# Patient Record
Sex: Female | Born: 2000 | Race: White | Hispanic: No | Marital: Single | Smoking: Never smoker
Health system: Southern US, Community
[De-identification: ages and names within clinical notes are randomized; demographics above are authoritative.]

---
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
## 2020-06-03 ENCOUNTER — Encounter (HOSPITAL_COMMUNITY): Payer: Self-pay | Admitting: Licensed Clinical Social Worker

## 2020-06-03 ENCOUNTER — Other Ambulatory Visit: Payer: Self-pay

## 2020-06-03 ENCOUNTER — Ambulatory Visit (INDEPENDENT_AMBULATORY_CARE_PROVIDER_SITE_OTHER): Payer: Self-pay | Admitting: Licensed Clinical Social Worker

## 2020-06-03 DIAGNOSIS — F4321 Adjustment disorder with depressed mood: Secondary | ICD-10-CM
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
## 2020-06-19 ENCOUNTER — Other Ambulatory Visit: Payer: Self-pay

## 2020-06-19 ENCOUNTER — Ambulatory Visit (HOSPITAL_COMMUNITY): Payer: Self-pay | Admitting: Licensed Clinical Social Worker

## 2020-07-01 ENCOUNTER — Ambulatory Visit (HOSPITAL_COMMUNITY): Payer: Self-pay | Admitting: Licensed Clinical Social Worker

## 2020-07-02 ENCOUNTER — Other Ambulatory Visit: Payer: Self-pay

## 2020-07-02 ENCOUNTER — Ambulatory Visit (INDEPENDENT_AMBULATORY_CARE_PROVIDER_SITE_OTHER): Payer: No Typology Code available for payment source | Admitting: Licensed Clinical Social Worker

## 2020-07-02 ENCOUNTER — Encounter (HOSPITAL_COMMUNITY): Payer: Self-pay | Admitting: Licensed Clinical Social Worker

## 2020-07-02 DIAGNOSIS — F4321 Adjustment disorder with depressed mood: Secondary | ICD-10-CM | POA: Diagnosis not present

## 2020-07-02 NOTE — Progress Notes (Signed)
Virtual Visit via Video Note  I connected with Kayla Nguyen on 07/02/20 at 12:30 PM EDT by a video enabled telemedicine application and verified that I am speaking with the correct person using two identifiers.  Location: Patient: home Provider: home office   I discussed the limitations of evaluation and management by telemedicine and the availability of in person appointments. The patient expressed understanding and agreed to proceed.  Type of Therapy: Individual    Treatment Goals addressed: "I just need someone to talk to about my family situation, get some advice, and accept me for who I am. Currently feeling depressed almost every day. Current goal will be to report depression less than 5 days per week".   Interventions: CBT and Motivational Interviewing    Summary: Kayla Nguyen is a 20 y.o. female who presents with Panic Disorder    Suicidal/Homicidal: No without intent/plan    Therapist Response: [name] met with clinician for an individual session. Kayla Nguyen discussed her psychiatric symptoms, her current life events and her homework. Kayla Nguyen shared that she has been doing pretty good this week. She reports that today is her 2 year anniversary with her boyfriend, and they have almost lived together for 1 year. Clinician explored family relationships and noted that her parents continue to be accepting of her decision to move out, even though they do not like her to live with her boyfriend out of wedlock. Kayla Nguyen reports she continues to be the "black sheep" with her grandparents and her aunt and uncle. Clinician processed the impact of religion on the value system of her family members. Clinician also noted that Kayla Nguyen is an adult and can make her own decisions, whether they are "good or bad". Clinician encouraged Kayla Nguyen to remain close to her parents, who are supportive, and to work on reducing the impact of her extended family's negative opinions.    Plan: Return again in 2-3 weeks.     Diagnosis: Adjustment Disorder with Depressed mood     I discussed the assessment and treatment plan with the patient. The patient was provided an opportunity to ask questions and all were answered. The patient agreed with the plan and demonstrated an understanding of the instructions.   The patient was advised to call back or seek an in-person evaluation if the symptoms worsen or if the condition fails to improve as anticipated.  I provided 45 minutes of non-face-to-face time during this encounter.   Mindi Curling, LCSW

## 2020-07-15 ENCOUNTER — Ambulatory Visit (HOSPITAL_COMMUNITY): Payer: No Typology Code available for payment source | Admitting: Licensed Clinical Social Worker

## 2020-07-15 ENCOUNTER — Other Ambulatory Visit: Payer: Self-pay

## 2020-07-29 ENCOUNTER — Ambulatory Visit (INDEPENDENT_AMBULATORY_CARE_PROVIDER_SITE_OTHER): Payer: No Typology Code available for payment source | Admitting: Licensed Clinical Social Worker

## 2020-07-29 ENCOUNTER — Other Ambulatory Visit: Payer: Self-pay

## 2020-07-29 DIAGNOSIS — F4321 Adjustment disorder with depressed mood: Secondary | ICD-10-CM | POA: Diagnosis not present

## 2020-07-31 ENCOUNTER — Encounter (HOSPITAL_COMMUNITY): Payer: Self-pay | Admitting: Licensed Clinical Social Worker

## 2020-07-31 NOTE — Progress Notes (Signed)
Virtual Visit via Video Note  I connected with Kayla Nguyen on 07/31/20 at 12:30 PM EDT by a video enabled telemedicine application and verified that I am speaking with the correct person using two identifiers.  Location: Patient: home Provider: home office   I discussed the limitations of evaluation and management by telemedicine and the availability of in person appointments. The patient expressed understanding and agreed to proceed.   Type of Therapy: Individual   Treatment Goals addressed: "I just need someone to talk to about my family situation, get some advice, and accept me for who I am. Currently feeling depressed almost every day. Current goal will be to report depression less than 5 days per week".   Interventions: CBT and Motivational Interviewing   Summary: Kayla Nguyen is a 20 y.o. female who presents with Panic Disorder   Suicidal/Homicidal: No without intent/plan   Therapist Response: [name] met with clinician for an individual session. Kayla Nguyen discussed her psychiatric symptoms, her current life events and her homework. Kayla Nguyen shared that things are better this week due to news that the roommate will be moving out in July. Clinician explored this update and processed thoughts and feelings using CBT. Clinician explored the pros and cons of the roommate moving out and noted some nervousness and excitement about having the house to herself with just her boyfriend. Kayla Nguyen shared a challenge in her relationship related to time spent together and alone. Clinician utilized CBT to assist in reality testing about her expectations of spending time with boyfriend. Clinician also processed communication skills and assisted in creating a script to talk to boyfriend about the time he spends on the video game vs. with [name].    Plan: Return again in 2-3 weeks.   Diagnosis: Adjustment Disorder with Depressed mood   I discussed the assessment and treatment plan with the patient. The  patient was provided an opportunity to ask questions and all were answered. The patient agreed with the plan and demonstrated an understanding of the instructions.   The patient was advised to call back or seek an in-person evaluation if the symptoms worsen or if the condition fails to improve as anticipated.  I provided 45 minutes of non-face-to-face time during this encounter.   Mindi Curling, LCSW

## 2020-08-12 ENCOUNTER — Ambulatory Visit (HOSPITAL_COMMUNITY): Payer: No Typology Code available for payment source | Admitting: Licensed Clinical Social Worker

## 2020-08-12 ENCOUNTER — Other Ambulatory Visit: Payer: Self-pay

## 2020-09-15 ENCOUNTER — Other Ambulatory Visit: Payer: Self-pay | Admitting: Neurology

## 2020-09-15 DIAGNOSIS — R519 Headache, unspecified: Secondary | ICD-10-CM

## 2020-09-19 ENCOUNTER — Ambulatory Visit
Admission: RE | Admit: 2020-09-19 | Discharge: 2020-09-19 | Disposition: A | Discharge status: Home / Self Care | Payer: No Typology Code available for payment source | Source: Ambulatory Visit | Attending: Neurology | Admitting: Neurology

## 2020-09-19 ENCOUNTER — Other Ambulatory Visit: Payer: Self-pay

## 2020-09-19 DIAGNOSIS — R519 Headache, unspecified: Secondary | ICD-10-CM | POA: Insufficient documentation

## 2021-01-06 ENCOUNTER — Ambulatory Visit
Admission: EM | Admit: 2021-01-06 | Discharge: 2021-01-06 | Disposition: A | Discharge status: Home / Self Care | Payer: No Typology Code available for payment source | Attending: Emergency Medicine | Admitting: Emergency Medicine

## 2021-01-06 ENCOUNTER — Ambulatory Visit (INDEPENDENT_AMBULATORY_CARE_PROVIDER_SITE_OTHER): Payer: No Typology Code available for payment source

## 2021-01-06 ENCOUNTER — Other Ambulatory Visit: Payer: Self-pay

## 2021-01-06 ENCOUNTER — Encounter: Payer: Self-pay | Admitting: Emergency Medicine

## 2021-01-06 DIAGNOSIS — M79645 Pain in left finger(s): Secondary | ICD-10-CM | POA: Diagnosis not present

## 2021-01-06 NOTE — ED Triage Notes (Signed)
Pt here with left index finger pain from overuse on a machine from work.

## 2021-01-06 NOTE — ED Provider Notes (Signed)
Kayla Nguyen    CSN: [id number] Arrival date & time: 01/06/21  1745      History   Chief Complaint Chief Complaint  Patient presents with   Finger Injury    HPI Kayla Nguyen is a 20 y.o. female.  Patient presents with 1 week history of pain and swelling in her left index finger at the PIP joint.  No falls or trauma.  She states she believes the pain is from overuse at work.  She denies numbness, weakness, paresthesias, wounds, redness, bruising, or other symptoms.  No treatments attempted at home.  Patient requests an x-ray of her finger and a finger splint.  The history is provided by the patient.   History reviewed. No pertinent past medical history.  Patient Active Problem List   Diagnosis Date Noted   Croup 01/21/2011    Class: Acute    History reviewed. No pertinent surgical history.  OB History   No obstetric history on file.      Home Medications    Prior to Admission medications   Medication Sig Start Date End Date Taking? Authorizing Provider  ALPRAZolam Prudy Feeler) 0.5 MG tablet Take 0.5 mg by mouth at bedtime as needed for anxiety.    [provider]    Family History Family History  Problem Relation Age of Onset   Healthy Mother    Healthy Father     Social History Social History   Tobacco Use   Smoking status: Never   Smokeless tobacco: Never  Vaping Use   Vaping Use: Never used  Substance Use Topics   Alcohol use: No   Drug use: No     Allergies   Patient has no known allergies.   Review of Systems Review of Systems  Constitutional:  Negative for chills and fever.  Respiratory:  Negative for cough and shortness of breath.   Musculoskeletal:  Positive for arthralgias and joint swelling.  Skin:  Negative for color change, rash and wound.  Neurological:  Negative for weakness and numbness.  All other systems reviewed and are negative.   Physical Exam Triage Vital Signs ED Triage Vitals  Enc Vitals Group      BP      Pulse      Resp      Temp      Temp src      SpO2      Weight      Height      Head Circumference      Peak Flow      Pain Score      Pain Loc      Pain Edu?      Excl. in GC?    No data found.  Updated Vital Signs BP 135/88 (BP Location: Left Arm)   Pulse 80   Temp 98.2 F (36.8 C) (Oral)   Resp 16   LMP 12/30/2020 (Approximate)   SpO2 99%   Visual Acuity Right Eye Distance:   Left Eye Distance:   Bilateral Distance:    Right Eye Near:   Left Eye Near:    Bilateral Near:     Physical Exam Vitals and nursing note reviewed.  Constitutional:      General: She is not in acute distress.    Appearance: She is well-developed.  HENT:     Head: Normocephalic and atraumatic.     Mouth/Throat:     Mouth: Mucous membranes are moist.  Eyes:     Conjunctiva/sclera: Conjunctivae  normal.  Cardiovascular:     Rate and Rhythm: Normal rate and regular rhythm.     Heart sounds: Normal heart sounds.  Pulmonary:     Effort: Pulmonary effort is normal. No respiratory distress.     Breath sounds: Normal breath sounds.  Abdominal:     Palpations: Abdomen is soft.     Tenderness: There is no abdominal tenderness.  Musculoskeletal:        General: Swelling and tenderness present. No deformity. Normal range of motion.       Hands:     Cervical back: Neck supple.     Comments: Mild edema and tenderness of left index PIP.  Skin:    General: Skin is warm and dry.     Capillary Refill: Capillary refill takes less than 2 seconds.     Findings: No bruising, erythema, lesion or rash.  Neurological:     General: No focal deficit present.     Mental Status: She is alert and oriented to person, place, and time.     Sensory: No sensory deficit.     Motor: No weakness.     Gait: Gait normal.  Psychiatric:        Mood and Affect: Mood normal.        Behavior: Behavior normal.     UC Treatments / Results  Labs (all labs ordered are listed, but only abnormal results are  displayed) Labs Reviewed - No data to display  EKG   Radiology DG Finger Index Left  Result Date: 01/06/2021 CLINICAL DATA:  Equipment injury. Swelling with decreased range of motion. Pain. EXAM: LEFT INDEX FINGER 2+V COMPARISON:  None. FINDINGS: There is no evidence of fracture or dislocation. Normal joint spaces and alignment. There is no evidence of arthropathy or other focal bone abnormality. No erosion or periosteal reaction. Mild soft tissue edema. No soft tissue air. No radiopaque foreign body IMPRESSION: Mild soft tissue edema without osseous abnormality. Electronically Signed   By: Narda Rutherford M.D.   On: 01/06/2021 19:04    Procedures Procedures (including critical care time)  Medications Ordered in UC Medications - No data to display  Initial Impression / Assessment and Plan / UC Course  I have reviewed the triage vital signs and the nursing notes.  Pertinent labs & imaging results that were available during my care of the patient were reviewed by me and considered in my medical decision making (see chart for details).  Left index finger pain and swelling.  X-ray negative for bone abnormality.  Treating with Tylenol or ibuprofen, rest, finger splint.  Instructed patient to follow-up with an orthopedic hand specialist if her symptoms are not improving.  She agrees to plan of care.   Final Clinical Impressions(s) / UC Diagnoses   Final diagnoses:  Pain of finger of left hand     Discharge Instructions      Take Tylenol or ibuprofen as needed for discomfort.  Rest your finger.  Wear the finger splint as needed for comfort.  Follow-up with an orthopedic hand specialist such as the one listed below if your symptoms are not improving.     ED Prescriptions   None    PDMP not reviewed this encounter.   Mickie Bail, NP 01/06/21 1910

## 2021-01-06 NOTE — Discharge Instructions (Addendum)
Take Tylenol or ibuprofen as needed for discomfort.  Rest your finger.  Wear the finger splint as needed for comfort.  Follow-up with an orthopedic hand specialist such as the one listed below if your symptoms are not improving.

## 2021-05-14 ENCOUNTER — Ambulatory Visit (HOSPITAL_COMMUNITY): Payer: No Typology Code available for payment source | Admitting: Licensed Clinical Social Worker

## 2021-05-14 ENCOUNTER — Other Ambulatory Visit: Payer: Self-pay

## 2021-05-26 ENCOUNTER — Ambulatory Visit (HOSPITAL_COMMUNITY): Payer: No Typology Code available for payment source | Admitting: Licensed Clinical Social Worker

## 2021-05-28 ENCOUNTER — Ambulatory Visit (HOSPITAL_COMMUNITY): Payer: No Typology Code available for payment source | Admitting: Licensed Clinical Social Worker

## 2021-09-09 ENCOUNTER — Ambulatory Visit (INDEPENDENT_AMBULATORY_CARE_PROVIDER_SITE_OTHER): Payer: No Typology Code available for payment source | Admitting: Licensed Clinical Social Worker

## 2021-09-09 DIAGNOSIS — F4321 Adjustment disorder with depressed mood: Secondary | ICD-10-CM | POA: Diagnosis not present

## 2021-09-11 ENCOUNTER — Encounter (HOSPITAL_COMMUNITY): Payer: Self-pay | Admitting: Licensed Clinical Social Worker

## 2021-09-11 NOTE — Progress Notes (Signed)
Virtual Visit via Video Note  I connected with Kayla Nguyen on 09/11/21 at  1:30 PM EDT by a video enabled telemedicine application and verified that I am speaking with the correct person using two identifiers.  Location: Patient: home Provider: home office   I discussed the limitations of evaluation and management by telemedicine and the availability of in person appointments. The patient expressed understanding and agreed to proceed.   I discussed the assessment and treatment plan with the patient. The patient was provided an opportunity to ask questions and all were answered. The patient agreed with the plan and demonstrated an understanding of the instructions.   The patient was advised to call back or seek an in-person evaluation if the symptoms worsen or if the condition fails to improve as anticipated.  I provided 45 minutes of non-face-to-face time during this encounter.   Kayla Melter, LCSW   THERAPIST PROGRESS NOTE  Session Time: 1:30pm-2:15pm  Participation Level: Active  Behavioral Response: Well GroomedAlertAnxious and Depressed  Type of Therapy: Individual Therapy  Treatment Goals addressed: : "I just need someone to talk to about my family situation, get some advice, and accept me for who I am. Currently feeling depressed almost every day. Current goal will be to report depression less than 5 days per week".    ProgressTowards Goals: Progressing  Interventions: Motivational Interviewing  Summary: Kayla Nguyen is a 21 y.o. female who presents with Adjustment Disorder with depressed mood  Suicidal/Homicidal: Nowithout intent/plan  Therapist Response: Kayla Nguyen engaged well in individual virtual session with clinician. Clinician utilized MI OARS to reflect and summarize thoughts and feelings about relationship with boyfriend. Clinician processed current stressors and what has become a pattern of communication in their relationship. Kayla Nguyen shared that her  boyfriend often says unkind words, calls her names, and "acts like my dad". Clinician explored Kayla Nguyen's thoughts and feelings about how she is treated and utilized MI to explore willingness to make a change. Clinician explored alternative housing, as the lease will be up in September or October. Kayla Nguyen shared that mom and dad have separated due to the same reason, but worse over 30 years. Clinician discussed concerns about moving in with mom due to mom's involvement with family members who would be unsupportive. Clinician reviewed coping skills and encouraged Kayla Nguyen to communicate with mom about her options for leaving the relationship.    Plan: Return again in 3 weeks.  Diagnosis: Adjustment disorder with depressed mood  Collaboration of Care: Other none required  Patient/Guardian was advised Release of Information must be obtained prior to any record release in order to collaborate their care with an outside provider. Patient/Guardian was advised if they have not already done so to contact the registration department to sign all necessary forms in order for [place] to release information regarding their care.   Consent: Patient/Guardian gives verbal consent for treatment and assignment of benefits for services provided during this visit. Patient/Guardian expressed understanding and agreed to proceed.   Kayla Nguyen [place], LCSW 09/11/2021

## 2021-10-07 ENCOUNTER — Ambulatory Visit (HOSPITAL_COMMUNITY): Payer: No Typology Code available for payment source | Admitting: Licensed Clinical Social Worker

## 2021-10-30 ENCOUNTER — Ambulatory Visit
Admission: EM | Admit: 2021-10-30 | Discharge: 2021-10-30 | Disposition: A | Discharge status: Home / Self Care | Payer: No Typology Code available for payment source

## 2021-10-30 DIAGNOSIS — R519 Headache, unspecified: Secondary | ICD-10-CM

## 2021-10-30 NOTE — ED Triage Notes (Signed)
Patient to Urgent Care with complaints of a migraine x1 day, reports headache and neck pain, pain predominately behind her eyes. History of migraines. Not currently on any prescribed medications.   Reports that her work required that she be seen today for a work note.

## 2021-10-30 NOTE — Discharge Instructions (Signed)
As discussed, try Excedrin or generic equivalent.    Follow-up with your primary care provider regarding recommended abortive therapy.

## 2021-10-30 NOTE — ED Provider Notes (Signed)
[facility]   [id number] 10/30/21 Arrival Time: 1044  ASSESSMENT & PLAN:  1. Acute nonintractable headache, unspecified headache type    No orders of the defined types were placed in this encounter.  neurological exam grossly normal. Afebrile without nuchal rigidity. Discussed. Current presentation and symptoms are consistent with prior headache and are not consistent with SAH, ICH, meningitis, or temporal arteritis.  Without fever, focal neuro logical deficits, nuchal rigidity, or change in vision. No indication for neurodiagnostic workup at this time. Discussed.While she describes her symptoms as "migraine", they may be more consistent with tension or cluster headaches.  Recommended use of warm moist compresses, hot shower, massage to relieve muscle tension in her posterior neck.  Recommended use of analgesic containing caffeine and aspirin rather than extra strength Tylenol if Tylenol is not providing adequate relief alone.  Asked her to follow-up with her primary care provider with whom she has not discussed these headaches yet.  Suggested other treatments for abortive therapy are available.  Recommend:  Follow-up Information     Dois Davenport, MD.   Specialty: Family Medicine Contact information: [address] [place] [address] [phone]                  Reviewed expectations re: course of current medical issues. Questions answered. Outlined signs and symptoms indicating need for more acute intervention. Patient verbalized understanding. After Visit Summary given.   SUBJECTIVE: History from: patient Patient is able to give a clear and coherent history.  Kayla Nguyen is a 21 y.o. female who presents with complaint of a migraine headache. Onset abrupt, today. Location:  Starts on the back of her head and moves across the top behind her eyes.  without radiation. History of headaches: yes. Precipitating factors include:  stress. Associated symptoms: Preceding aura: no. Nausea/vomiting: no. Vision changes: no. Increased sensitivity to light and to noises: no. Fever: no. Sinus pressure/congestion: no. Extremity weakness: no. Home treatment has included acetaminophen with some improvement. Current headache has limited normal daily activities. Denies loss of balance. No head injury reported. Ambulatory without difficulty. No recent travel.  ROS: As per HPI. All other systems negative.    OBJECTIVE:  Vitals:   10/30/21 1054 10/30/21 1101  BP: 120/82   Pulse: 88   Resp: 18   Temp: 98.4 F (36.9 C)   SpO2: 98%   Weight:  220 lb (99.8 kg)  Height:  5\' 5"  (1.651 m)    General appearance: alert; HENT: normocephalic; atraumatic Eyes: PERRLA; EOMI; conjunctivae normal Neck: supple with FROM Lungs: clear to auscultation bilaterally; unlabored respirations Heart: regular rate and rhythm Extremities: no edema; symmetrical with no gross deformities Skin: warm and dry Neurologic: alert; speech is fluent and clear without dysarthria or aphasia; CN 2-12 grossly intact; no facial droop; normal gait; normal symmetric reflexes; normal extremity strength and sensation throughout; bilateral upper and lower extremity sensation is grossly intact with 5/5 symmetric strength; normal grip strength; normal plantar and dorsi flexion bilaterally; normal finger to nose bilaterally; negative pronator drift; negative Romberg sign Psychological: alert and cooperative; normal mood and affect  Labs: Results for orders placed or performed during the hospital encounter of 04/23/19  Novel Coronavirus, NAA ([facility])   Specimen: Nasopharyngeal(NP) swabs in vial transport medium   NASOPHARYNGE  RELEASE  Result Value Ref Range   SARS-CoV-2, NAA Not Detected Not Detected  Specimen status report  Result Value Ref Range   specimen status report Comment    Labs  Reviewed - No data to display  Imaging: No results found.  No  Known Allergies  History reviewed. No pertinent past medical history. Social History   Socioeconomic History   Marital status: Single    Spouse name: Not on file   Number of children: Not on file   Years of education: Not on file   Highest education level: Not on file  Occupational History   Not on file  Tobacco Use   Smoking status: Never   Smokeless tobacco: Never  Vaping Use   Vaping Use: Never used  Substance and Sexual Activity   Alcohol use: No   Drug use: No   Sexual activity: Never  Other Topics Concern   Not on file  Social History Narrative   Not on file   Social Determinants of Health   Financial Resource Strain: Not on file  Food Insecurity: Not on file  Transportation Needs: Not on file  Physical Activity: Not on file  Stress: Not on file  Social Connections: Not on file  Intimate Partner Violence: Not on file   Family History  Problem Relation Age of Onset   Healthy Mother    Healthy Father    History reviewed. No pertinent surgical history.    Charma Igo, [place] 10/30/21 1122

## 2022-02-09 ENCOUNTER — Ambulatory Visit
Admission: EM | Admit: 2022-02-09 | Discharge: 2022-02-09 | Disposition: A | Discharge status: Home / Self Care | Payer: No Typology Code available for payment source

## 2022-02-09 ENCOUNTER — Encounter: Payer: Self-pay | Admitting: Physician Assistant

## 2022-02-09 DIAGNOSIS — G43809 Other migraine, not intractable, without status migrainosus: Secondary | ICD-10-CM | POA: Diagnosis not present

## 2022-02-09 NOTE — ED Triage Notes (Signed)
Pt presents to uc with co of migraine from this morning and needs a work note. Pt reports 1000 mg of tylenol and muscle relaxer pain is now 4/10 and now more in neck

## 2022-02-09 NOTE — ED Provider Notes (Signed)
[facility]    CSN: [id number] Arrival date & time: 02/09/22  1549      History   Chief Complaint Chief Complaint  Patient presents with   Migraine    HPI Kayla Nguyen is a 21 y.o. female.   Patient here today for evaluation of headache that she woke with this morning. She states pain was present diffusely to her upper head as well as her neck and upper back. She notes pain is now a 4 on 0-10 pain scale. She took tylenol and muscle relaxer but this did not seem to be helpful. She does have history of migraines and states they are different somewhat each time. She needs a note for work excusing her for missing today.   The history is provided by the patient.  Migraine Associated symptoms include headaches. Pertinent negatives include no abdominal pain and no shortness of breath.    History reviewed. No pertinent past medical history.  Patient Active Problem List   Diagnosis Date Noted   Croup 01/21/2011    Class: Acute    History reviewed. No pertinent surgical history.  OB History   No obstetric history on file.      Home Medications    Prior to Admission medications   Medication Sig Start Date End Date Taking? Authorizing Provider  ALPRAZolam Prudy Feeler) 0.5 MG tablet Take 0.5 mg by mouth at bedtime as needed for anxiety.    [provider]    Family History Family History  Problem Relation Age of Onset   Healthy Mother    Healthy Father     Social History Social History   Tobacco Use   Smoking status: Never   Smokeless tobacco: Never  Vaping Use   Vaping Use: Never used  Substance Use Topics   Alcohol use: No   Drug use: No     Allergies   Patient has no known allergies.   Review of Systems Review of Systems  Constitutional:  Negative for chills and fever.  Eyes:  Negative for discharge and redness.  Respiratory:  Negative for shortness of breath.   Gastrointestinal:  Negative for abdominal pain, nausea and vomiting.   Musculoskeletal:  Positive for myalgias and neck pain.  Neurological:  Positive for headaches.     Physical Exam Triage Vital Signs ED Triage Vitals  Enc Vitals Group     BP 02/09/22 1703 128/75     Pulse Rate 02/09/22 1703 94     Resp 02/09/22 1703 16     Temp 02/09/22 1703 98.3 F (36.8 C)     Temp Source 02/09/22 1703 Oral     SpO2 02/09/22 1703 98 %     Weight --      Height --      Head Circumference --      Peak Flow --      Pain Score 02/09/22 1702 4     Pain Loc --      Pain Edu? --      Excl. in GC? --    No data found.  Updated Vital Signs BP 128/75   Pulse 94   Temp 98.3 F (36.8 C) (Oral)   Resp 16   LMP 01/19/2022 (Exact Date)   SpO2 98%      Physical Exam Vitals and nursing note reviewed.  Constitutional:      General: She is not in acute distress.    Appearance: Normal appearance. She is not ill-appearing.  HENT:  Head: Normocephalic and atraumatic.     Nose: Nose normal. No congestion or rhinorrhea.  Eyes:     Extraocular Movements: Extraocular movements intact.     Conjunctiva/sclera: Conjunctivae normal.     Pupils: Pupils are equal, round, and reactive to light.  Cardiovascular:     Rate and Rhythm: Normal rate.  Pulmonary:     Effort: Pulmonary effort is normal.  Neurological:     General: No focal deficit present.     Mental Status: She is alert and oriented to person, place, and time.     Comments: Normal finger to nose, normal heel to shin, no pronator drift, no facial droop, normal speech  Psychiatric:        Mood and Affect: Mood normal.        Behavior: Behavior normal.        Thought Content: Thought content normal.      UC Treatments / Results  Labs (all labs ordered are listed, but only abnormal results are displayed) Labs Reviewed - No data to display  EKG   Radiology No results found.  Procedures Procedures (including critical care time)  Medications Ordered in UC Medications - No data to  display  Initial Impression / Assessment and Plan / UC Course  I have reviewed the triage vital signs and the nursing notes.  Pertinent labs & imaging results that were available during my care of the patient were reviewed by me and considered in my medical decision making (see chart for details).    Offered toradol injection in office which patient kindly declines. Advised ibuprofen OTC to hopefully help improve pain. Encouraged follow up if no gradual improvement or with any further concerns.   Final Clinical Impressions(s) / UC Diagnoses   Final diagnoses:  Other migraine without status migrainosus, not intractable   Discharge Instructions   None    ED Prescriptions   None    PDMP not reviewed this encounter.   Tomi Bamberger, PA-C 02/09/22 1745

## 2022-05-24 DIAGNOSIS — Z419 Encounter for procedure for purposes other than remedying health state, unspecified: Secondary | ICD-10-CM | POA: Diagnosis not present

## 2022-06-04 IMAGING — DX DG FINGER INDEX 2+V*L*
3 series · 3 of 3 positions shown · non-contrast
Comparison: None.

CLINICAL DATA: Equipment injury. Swelling with decreased range of
motion. Pain.

EXAM:
LEFT INDEX FINGER 2+V

[finger pa]
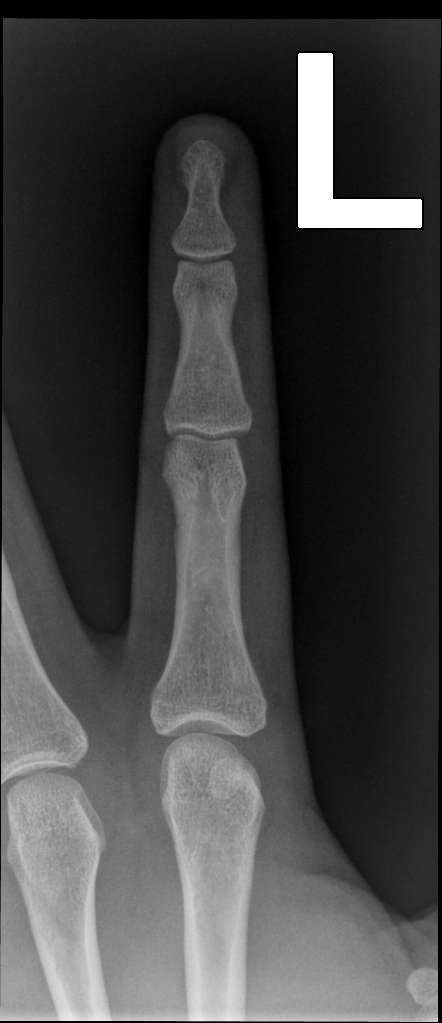

[finger mlo]
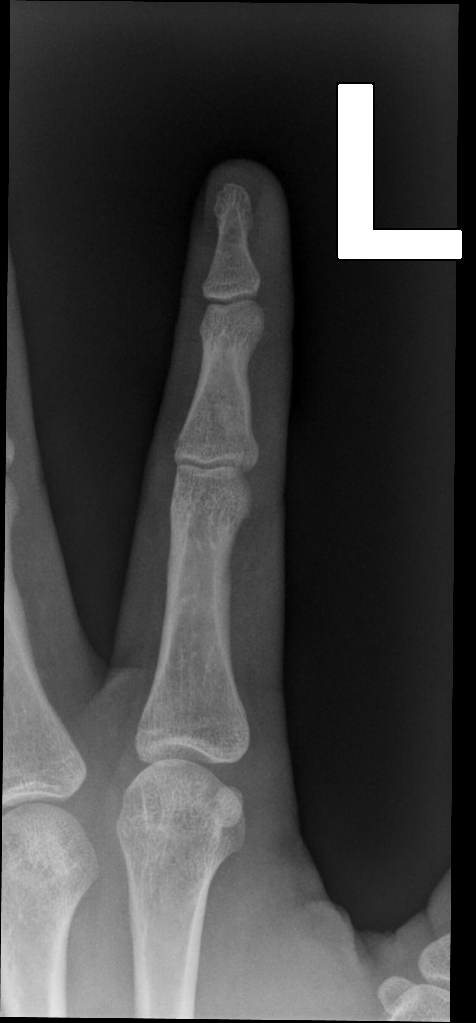

[2. finger lat]
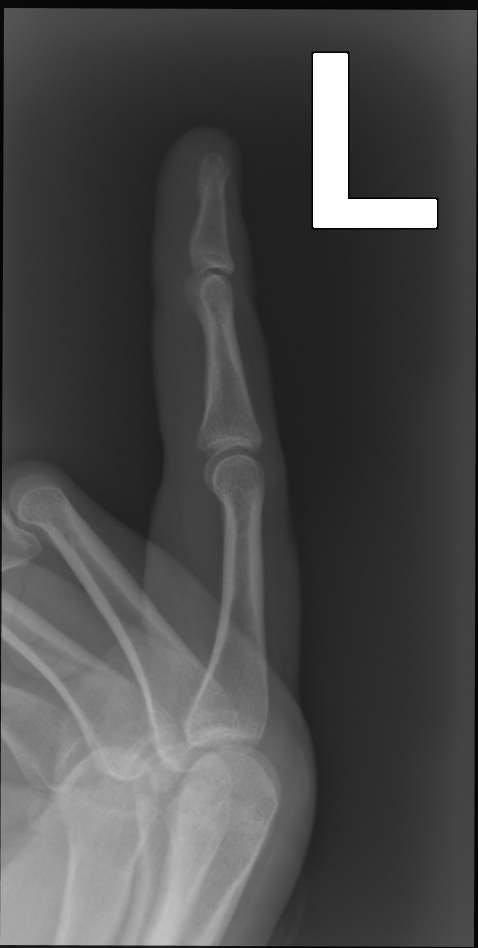

[3 of 3 positions shown; findings below may reference images not displayed]

FINDINGS: There is no evidence of fracture or dislocation. Normal joint spaces
and alignment. There is no evidence of arthropathy or other focal
bone abnormality. No erosion or periosteal reaction. Mild soft
tissue edema. No soft tissue air. No radiopaque foreign body
IMPRESSION: Mild soft tissue edema without osseous abnormality.

## 2022-06-23 DIAGNOSIS — Z419 Encounter for procedure for purposes other than remedying health state, unspecified: Secondary | ICD-10-CM | POA: Diagnosis not present

## 2022-07-12 ENCOUNTER — Ambulatory Visit
Admission: EM | Admit: 2022-07-12 | Discharge: 2022-07-12 | Disposition: A | Discharge status: Home / Self Care | Payer: No Typology Code available for payment source

## 2022-07-12 DIAGNOSIS — J019 Acute sinusitis, unspecified: Secondary | ICD-10-CM

## 2022-07-12 DIAGNOSIS — B9789 Other viral agents as the cause of diseases classified elsewhere: Secondary | ICD-10-CM

## 2022-07-12 NOTE — ED Provider Notes (Signed)
Kayla Nguyen    CSN: [id number] Arrival date & time: 07/12/22  1515      History   Chief Complaint Chief Complaint  Patient presents with   Cough   Sore Throat   Nasal Congestion    HPI Kayla Nguyen is a 22 y.o. female.    Cough Sore Throat    Patient presents to urgent care with complaint of sore throat, cough, nasal congestion x 3 days.  Using OTC medications.  Denies fever.  She endorses severe pressure and pain in her ears, especially on the right.  She states that her uncle put peroxide in her ear after she developed pain and she then developed severe dizziness.   History reviewed. No pertinent past medical history.  Patient Active Problem List   Diagnosis Date Noted   Croup 01/21/2011    Class: Acute    History reviewed. No pertinent surgical history.  OB History   No obstetric history on file.      Home Medications    Prior to Admission medications   Medication Sig Start Date End Date Taking? Authorizing Provider  ALPRAZolam Prudy Feeler) 0.5 MG tablet Take 0.5 mg by mouth at bedtime as needed for anxiety.    [provider]    Family History Family History  Problem Relation Age of Onset   Healthy Mother    Healthy Father     Social History Social History   Tobacco Use   Smoking status: Never   Smokeless tobacco: Never  Vaping Use   Vaping Use: Never used  Substance Use Topics   Alcohol use: No   Drug use: No     Allergies   Patient has no known allergies.   Review of Systems Review of Systems  Respiratory:  Positive for cough.      Physical Exam Triage Vital Signs ED Triage Vitals  Enc Vitals Group     BP 07/12/22 1546 (!) 152/95     Pulse Rate 07/12/22 1546 82     Resp 07/12/22 1546 18     Temp 07/12/22 1546 97.9 F (36.6 C)     Temp Source 07/12/22 1546 Oral     SpO2 07/12/22 1546 98 %     Weight --      Height --      Head Circumference --      Peak Flow --      Pain Score 07/12/22 1545 0      Pain Loc --      Pain Edu? --      Excl. in GC? --    No data found.  Updated Vital Signs BP (!) 152/95 (BP Location: Left Arm)   Pulse 82   Temp 97.9 F (36.6 C) (Oral)   Resp 18   LMP 06/12/2022   SpO2 98%   Visual Acuity Right Eye Distance:   Left Eye Distance:   Bilateral Distance:    Right Eye Near:   Left Eye Near:    Bilateral Near:     Physical Exam Vitals reviewed.  Constitutional:      Appearance: She is well-developed. She is ill-appearing.  HENT:     Right Ear: A middle ear effusion is present. Tympanic membrane is not erythematous.     Left Ear: A middle ear effusion is present. Tympanic membrane is not erythematous.  Cardiovascular:     Rate and Rhythm: Normal rate and regular rhythm.     Heart sounds: Normal heart sounds.  Pulmonary:  Effort: Pulmonary effort is normal.     Breath sounds: Normal breath sounds.  Skin:    General: Skin is warm and dry.  Neurological:     General: No focal deficit present.     Mental Status: She is alert and oriented to person, place, and time.  Psychiatric:        Mood and Affect: Mood normal.        Behavior: Behavior normal.      UC Treatments / Results  Labs (all labs ordered are listed, but only abnormal results are displayed) Labs Reviewed - No data to display  EKG   Radiology No results found.  Procedures Procedures (including critical care time)  Medications Ordered in UC Medications - No data to display  Initial Impression / Assessment and Plan / UC Course  I have reviewed the triage vital signs and the nursing notes.  Pertinent labs & imaging results that were available during my care of the patient were reviewed by me and considered in my medical decision making (see chart for details).   Kayla Nguyen is a 22 y.o. female presenting with URI symptoms. Patient is afebrile without recent antipyretics, satting well on room air. Overall is ill appearing though non-toxic, well hydrated,  without respiratory distress. Pulmonary exam is unremarkable.  Lungs CTAB without wheezing, rhonchi, rales. RRR.  TMs appear cloudy and opaque, not erythematous.  Patient's symptoms are consistent with an acute viral process.  No suspicion for bacterial process at this stage in her illness.  Recommending use of oral and nasal decongestants to treat her symptoms.  Also suggested Benadryl might help her symptoms of dizziness though it would make her sleepy.  Commending supportive care and continued use of OTC medication.  Reviewed chart history.  Counseled patient on potential for adverse effects with medications prescribed/recommended today, ER and return-to-clinic precautions discussed, patient verbalized understanding and agreement with care plan.   Final Clinical Impressions(s) / UC Diagnoses   Final diagnoses:  None   Discharge Instructions   None    ED Prescriptions   None    PDMP not reviewed this encounter.   Charma Igo, [place] 07/12/22 1611

## 2022-07-12 NOTE — ED Triage Notes (Signed)
Patient presents to UC for sore throat, cough, and nasal congestion x 3 days. Taking OTC cough meds and tylenol.

## 2022-07-12 NOTE — Discharge Instructions (Addendum)
Recommend use of oral and nasal spray decongestants.  Sudafed is an oral decongestant that will help with your nasal congestion and assist with drainage of your sinuses.  Also suggest you try Flonase which is a steroid nasal spray.  Spray twice a day into each nostril.  Follow up here or with your primary care provider if your symptoms are worsening or not improving.

## 2022-07-24 DIAGNOSIS — Z419 Encounter for procedure for purposes other than remedying health state, unspecified: Secondary | ICD-10-CM | POA: Diagnosis not present

## 2022-08-23 DIAGNOSIS — Z419 Encounter for procedure for purposes other than remedying health state, unspecified: Secondary | ICD-10-CM | POA: Diagnosis not present

## 2022-09-23 DIAGNOSIS — Z419 Encounter for procedure for purposes other than remedying health state, unspecified: Secondary | ICD-10-CM | POA: Diagnosis not present

## 2022-10-24 DIAGNOSIS — Z419 Encounter for procedure for purposes other than remedying health state, unspecified: Secondary | ICD-10-CM | POA: Diagnosis not present

## 2022-11-12 ENCOUNTER — Ambulatory Visit: Payer: No Typology Code available for payment source

## 2022-11-23 DIAGNOSIS — Z419 Encounter for procedure for purposes other than remedying health state, unspecified: Secondary | ICD-10-CM | POA: Diagnosis not present

## 2022-12-24 DIAGNOSIS — Z419 Encounter for procedure for purposes other than remedying health state, unspecified: Secondary | ICD-10-CM | POA: Diagnosis not present

## 2022-12-27 ENCOUNTER — Ambulatory Visit (INDEPENDENT_AMBULATORY_CARE_PROVIDER_SITE_OTHER): Payer: No Typology Code available for payment source | Admitting: Family Medicine

## 2022-12-27 ENCOUNTER — Encounter: Payer: Self-pay | Admitting: Family Medicine

## 2022-12-27 VITALS — BP 121/68 | HR 72 | Resp 18 | Ht 65.0 in | Wt 230.0 lb

## 2022-12-27 DIAGNOSIS — Z6838 Body mass index (BMI) 38.0-38.9, adult: Secondary | ICD-10-CM | POA: Diagnosis not present

## 2022-12-27 DIAGNOSIS — Z7689 Persons encountering health services in other specified circumstances: Secondary | ICD-10-CM

## 2022-12-27 DIAGNOSIS — Z1159 Encounter for screening for other viral diseases: Secondary | ICD-10-CM

## 2022-12-27 DIAGNOSIS — E66812 Obesity, class 2: Secondary | ICD-10-CM

## 2022-12-27 DIAGNOSIS — Z3042 Encounter for surveillance of injectable contraceptive: Secondary | ICD-10-CM

## 2022-12-27 MED ORDER — MEDROXYPROGESTERONE ACETATE 150 MG/ML IM SUSP: 150.0000 mg | INTRAMUSCULAR | 3 refills | Status: DC

## 2022-12-27 NOTE — Patient Instructions (Addendum)
Once you pick up your Depo from the pharmacy, you can bring it to a nurse visit in the next week.

## 2022-12-27 NOTE — Progress Notes (Signed)
New Patient Office Visit  Subjective    Patient ID: Kayla Nguyen, female    DOB: 31-Jul-2000  Age: 22 y.o. MRN: [id number]  CC:  Chief Complaint  Patient presents with   Establish Care    HPI Kayla Nguyen presents to establish care.  No significant past medical history.  She is interested in restarting contraception.  She was previously on Depo injections and is interested in restarting contraception.  Family medical history significant for diabetes: Father, sister, paternal grandfather.  Her father also has a history of prostate and bladder cancer.  Mother has a history of polycythemia vera.     12/27/2022    1:42 PM 06/03/2020   11:57 AM  Depression screen PHQ 2/9  Decreased Interest 0   Down, Depressed, Hopeless 1   PHQ - 2 Score 1   Altered sleeping 1   Tired, decreased energy 1   Change in appetite 0   Feeling bad or failure about yourself  1   Trouble concentrating 0   Moving slowly or fidgety/restless 1   Suicidal thoughts 0   PHQ-9 Score 5   Difficult doing work/chores Somewhat difficult      Information is confidential and restricted. Go to Review Flowsheets to unlock data.      12/27/2022    1:42 PM  GAD 7 : Generalized Anxiety Score  Nervous, Anxious, on Edge 1  Control/stop worrying 1  Worry too much - different things 1  Trouble relaxing 1  Restless 0  Easily annoyed or irritable 1  Afraid - awful might happen 1  Total GAD 7 Score 6  Anxiety Difficulty Somewhat difficult    Outpatient Encounter Medications as of 12/27/2022  Medication Sig   medroxyPROGESTERone (DEPO-PROVERA) 150 MG/ML injection Inject 1 mL (150 mg total) into the muscle every 3 (three) months.   [DISCONTINUED] ALPRAZolam (XANAX) 0.5 MG tablet Take 0.5 mg by mouth at bedtime as needed for anxiety.   No facility-administered encounter medications on file as of 12/27/2022.    History reviewed. No pertinent past medical history.  History reviewed. No pertinent surgical  history.  Family History  Problem Relation Age of Onset   Polycythemia Mother    Diabetes Father    Prostate cancer Father    Bladder Cancer Father    Diabetes Sister    Diabetes Paternal Grandfather     Social History   Socioeconomic History   Marital status: Single    Spouse name: Not on file   Number of children: Not on file   Years of education: Not on file   Highest education level: Not on file  Occupational History   Not on file  Tobacco Use   Smoking status: Never    Passive exposure: Never   Smokeless tobacco: Never  Vaping Use   Vaping status: Never Used  Substance and Sexual Activity   Alcohol use: No   Drug use: No   Sexual activity: Yes    Birth control/protection: Condom  Other Topics Concern   Not on file  Social History Narrative   Not on file   Social Determinants of Health   Financial Resource Strain: Not on file  Food Insecurity: Not on file  Transportation Needs: Not on file  Physical Activity: Not on file  Stress: Not on file  Social Connections: Not on file  Intimate Partner Violence: Not on file    Review of Systems  Constitutional:  Negative for chills, fever and malaise/fatigue.  HENT:  Negative  for ear pain and sore throat.   Eyes:  Negative for blurred vision and pain.  Respiratory:  Negative for shortness of breath.   Cardiovascular:  Negative for chest pain and palpitations.  Gastrointestinal:  Negative for abdominal pain.  Neurological:  Negative for dizziness and headaches.    Objective    BP 121/68 (BP Location: Right Arm, Patient Position: Sitting, Cuff Size: Large)   Pulse 72   Resp 18   Ht 5\' 5"  (1.651 m)   Wt 230 lb (104.3 kg)   LMP 12/07/2022 (Approximate)   SpO2 100%   BMI 38.27 kg/m   Physical Exam Constitutional:      General: She is not in acute distress.    Appearance: Normal appearance. She is not ill-appearing.  HENT:     Head: Normocephalic and atraumatic.     Right Ear: Tympanic membrane, ear  canal and external ear normal.     Left Ear: Tympanic membrane, ear canal and external ear normal.     Nose: Nose normal. No congestion or rhinorrhea.     Mouth/Throat:     Mouth: Mucous membranes are moist.     Pharynx: Oropharynx is clear. No oropharyngeal exudate or posterior oropharyngeal erythema.  Eyes:     General:        Right eye: No discharge.        Left eye: No discharge.     Conjunctiva/sclera: Conjunctivae normal.     Pupils: Pupils are equal, round, and reactive to light.  Cardiovascular:     Rate and Rhythm: Normal rate and regular rhythm.     Heart sounds: No murmur heard.    No friction rub. No gallop.  Pulmonary:     Effort: Pulmonary effort is normal. No respiratory distress.     Breath sounds: Normal breath sounds. No wheezing, rhonchi or rales.  Musculoskeletal:     Cervical back: Normal range of motion. No tenderness.  Lymphadenopathy:     Cervical: No cervical adenopathy.  Skin:    General: Skin is warm and dry.  Neurological:     Mental Status: She is alert and oriented to person, place, and time.     Cranial Nerves: No cranial nerve deficit.     Deep Tendon Reflexes: Reflexes normal.      Assessment & Plan:  Encounter to establish care  Encounter for Depo-Provera contraception -     medroxyPROGESTERone Acetate; Inject 1 mL (150 mg total) into the muscle every 3 (three) months.  Dispense: 1 mL; Refill: 3   Pick up Depo from pharmacy, schedule nurse visit for first dose.  Return in about 2 months (around 02/26/2023) for annual physical + pap smear, fasting blood work 1 week before.   Melida Quitter, PA

## 2023-01-06 ENCOUNTER — Ambulatory Visit: Payer: No Typology Code available for payment source | Admitting: Family Medicine

## 2023-01-06 DIAGNOSIS — Z3042 Encounter for surveillance of injectable contraceptive: Secondary | ICD-10-CM

## 2023-01-06 LAB — POCT URINE PREGNANCY: Preg Test, Ur: NEGATIVE

## 2023-01-06 MED ORDER — MEDROXYPROGESTERONE ACETATE 150 MG/ML IM SUSP
150.0000 mg | INTRAMUSCULAR | Status: DC
  Administered 2023-01-06: 150 mg via INTRAMUSCULAR

## 2023-01-06 NOTE — Progress Notes (Signed)
  Last Depo-Provera: 01/06/2023 (1st dose). Side Effects if any: None. Serum HCG indicated? Negative. Depo-Provera 150 mg IM given by: Makinley Muscato L. RMA Right [place].

## 2023-01-19 ENCOUNTER — Ambulatory Visit: Payer: Self-pay | Admitting: Family Medicine

## 2023-01-19 DIAGNOSIS — U071 COVID-19: Secondary | ICD-10-CM | POA: Diagnosis not present

## 2023-01-19 DIAGNOSIS — Z6837 Body mass index (BMI) 37.0-37.9, adult: Secondary | ICD-10-CM | POA: Diagnosis not present

## 2023-01-19 NOTE — Telephone Encounter (Addendum)
Copied from CRM [id number]. Topic: Clinical - Red Word Triage >> Jan 19, 2023  2:04 PM Prudencio Pair wrote: Red Word that prompted transfer to Nurse Triage: Pt calling in stating that she was sent home from work. Been sick for about 3 days now. Pt is congested, has sore throat, and has vomited, which she thinks is from drainage. States she's trying to be seen today and will need a doctor's note.  Chief Complaint: Cold/Flu like symptoms Symptoms: Nasal congestion, post nasal drip, sore throat, generalized malaise, one episode of vomiting today around 10am but pt believes that is from the nasal drainage Frequency: x 3 days Pertinent Negatives: Patient denies fevers or difficulty breathing Disposition: [] ED /[x] Urgent Care (no appt availability in office) / [] Appointment(In office/virtual)/ []  [facility]/ [] Home Care/ [] Refused Recommended Disposition /[] [facility]/ []  Follow-up with PCP Additional Notes: Patient states that she has been sick for about 3 days with nasal congestion, post nasal drip, sore throat, and a dry cough.  She advised that she had one episode of vomiting around 10am today and she believes that may have been from the nasal drainage draining down her throat.  Pt denies fevers or difficulty breathing. Patient states her family has been sick this past week as well.  She states she was sent home from work due to being sick and was also going to need a work note.  Patient also wanted to be tested for flu and covid so she advised that she would go to an urgent care today (due to no appt availability at her PCP office today) Patient is given home care advice and she denies any fevers and states that she has been taking Dayquil for her symptoms.  Pt denied a virtual visit due to wanting a covid or flu test done in person. Patient is also advised that if anything changes or she gets worse and is concerned to call back, go to an urgent care immediately, or go to the emergency  room if needed. Reason for Disposition  Common cold with no complications  Answer Assessment - Initial Assessment Questions 1. ONSET: "When did the nasal discharge start?"      3 days ago 2. AMOUNT: "How much discharge is there?"      Pt feels like it is draining down her throat and feels nasally congested 3. COUGH: "Do you have a cough?" If Yes, ask: "Describe the color of your sputum" (clear, white, yellow, green)     Dry cough 4. RESPIRATORY DISTRESS: "Describe your breathing."      Denies but it is hard to breathe out of nose due to congestion 5. FEVER: "Do you have a fever?" If Yes, ask: "What is your temperature, how was it measured, and when did it start?"     Denies 6. SEVERITY: "Overall, how bad are you feeling right now?" (e.g., doesn't interfere with normal activities, staying home from school/work, staying in bed)      Pt sent home sick from work 7. OTHER SYMPTOMS: "Do you have any other symptoms?" (e.g., sore throat, earache, wheezing, vomiting)     Sore throat, one episode of vomiting around 10am 8. PREGNANCY: "Is there any chance you are pregnant?" "When was your last menstrual period?"     Denies-- Pt on birth control  Protocols used: Common Cold-A-AH

## 2023-01-23 DIAGNOSIS — Z419 Encounter for procedure for purposes other than remedying health state, unspecified: Secondary | ICD-10-CM | POA: Diagnosis not present

## 2023-02-17 ENCOUNTER — Telehealth: Payer: Self-pay

## 2023-02-17 NOTE — Telephone Encounter (Signed)
Patient would like to know if it is normal for her menstrual cycle to stop while on Depo Provera? She has had 2 episodes of light bleeding. Also would like to know if it is normal for injection site to still be sore from 01/06/23?

## 2023-02-17 NOTE — Telephone Encounter (Signed)
Please let the pt know it is normal for her menstrual periods to cease while she is taking depo provera shots.  They will return if she stops taking the shots every 3 months.    Her arm shouldn't be sore still if it was given 6 weeks ago.  If there is a rash or swelling she should have it looked at. She can try using a heating pad on the arm for pain relief.  When she sees morgan on 1/7 she can examine the patient's arm if it is still painful.

## 2023-02-17 NOTE — Telephone Encounter (Signed)
Copied from CRM [id number]. Topic: Clinical - Medical Advice >> Feb 17, 2023  9:02 AM Desma Mcgregor wrote: Reason for CRM: Pt has some questions about the depo shot she started back in Nov. Please contact her at [phone]

## 2023-02-18 NOTE — Telephone Encounter (Signed)
Sometimes this medication can cause irregular bleeding, especially when you first start taking it.  If she has been having daily menstrual bleeding for the past two weeks, we could try adding oral birth control pills on top of this for the next month.  This can often fix the irregular bleeding issue.  If it does not, she will need to talk to morgan about other birth control options besides the depo shot.

## 2023-02-18 NOTE — Telephone Encounter (Signed)
Called pt LVM to contact the office °

## 2023-02-21 NOTE — Telephone Encounter (Signed)
Called pt she stated that its under control at this time if this happens again she will think about other options

## 2023-02-22 ENCOUNTER — Other Ambulatory Visit: Payer: No Typology Code available for payment source

## 2023-02-22 DIAGNOSIS — Z1159 Encounter for screening for other viral diseases: Secondary | ICD-10-CM | POA: Diagnosis not present

## 2023-02-22 DIAGNOSIS — E66812 Obesity, class 2: Secondary | ICD-10-CM

## 2023-02-22 DIAGNOSIS — Z6838 Body mass index (BMI) 38.0-38.9, adult: Secondary | ICD-10-CM | POA: Diagnosis not present

## 2023-02-23 DIAGNOSIS — Z419 Encounter for procedure for purposes other than remedying health state, unspecified: Secondary | ICD-10-CM | POA: Diagnosis not present

## 2023-02-23 LAB — CBC WITH DIFFERENTIAL/PLATELET
Basophils Absolute: 0.1 10*3/uL (ref 0.0–0.2)
Basos: 1 %
EOS (ABSOLUTE): 0.2 10*3/uL (ref 0.0–0.4)
Eos: 2 %
Hematocrit: 43.2 % (ref 34.0–46.6)
Hemoglobin: 14.1 g/dL (ref 11.1–15.9)
Immature Grans (Abs): 0 10*3/uL (ref 0.0–0.1)
Immature Granulocytes: 0 %
Lymphocytes Absolute: 2.7 10*3/uL (ref 0.7–3.1)
Lymphs: 31 %
MCH: 26.6 pg (ref 26.6–33.0)
MCHC: 32.6 g/dL (ref 31.5–35.7)
MCV: 82 fL (ref 79–97)
Monocytes Absolute: 0.4 10*3/uL (ref 0.1–0.9)
Monocytes: 5 %
Neutrophils Absolute: 5.4 10*3/uL (ref 1.4–7.0)
Neutrophils: 61 %
Platelets: 403 10*3/uL (ref 150–450)
RBC: 5.3 x10E6/uL — ABNORMAL HIGH (ref 3.77–5.28)
RDW: 15.2 % (ref 11.7–15.4)
WBC: 8.7 10*3/uL (ref 3.4–10.8)

## 2023-02-23 LAB — COMPREHENSIVE METABOLIC PANEL
ALT: 16 [IU]/L (ref 0–32)
AST: 16 [IU]/L (ref 0–40)
Albumin: 4.6 g/dL (ref 4.0–5.0)
Alkaline Phosphatase: 110 [IU]/L (ref 44–121)
BUN/Creatinine Ratio: 15 (ref 9–23)
BUN: 10 mg/dL (ref 6–20)
Bilirubin Total: 0.3 mg/dL (ref 0.0–1.2)
CO2: 20 mmol/L (ref 20–29)
Calcium: 9.4 mg/dL (ref 8.7–10.2)
Chloride: 103 mmol/L (ref 96–106)
Creatinine, Ser: 0.68 mg/dL (ref 0.57–1.00)
Globulin, Total: 2.9 g/dL (ref 1.5–4.5)
Glucose: 86 mg/dL (ref 70–99)
Potassium: 4.4 mmol/L (ref 3.5–5.2)
Sodium: 139 mmol/L (ref 134–144)
Total Protein: 7.5 g/dL (ref 6.0–8.5)
eGFR: 126 mL/min/{1.73_m2} (ref >59)

## 2023-02-23 LAB — LIPID PANEL
Chol/HDL Ratio: 4.1 {ratio} (ref 0.0–4.4)
Cholesterol, Total: 184 mg/dL (ref 100–199)
HDL: 45 mg/dL (ref >39)
LDL Chol Calc (NIH): 120 mg/dL — ABNORMAL HIGH (ref 0–99)
Triglycerides: 104 mg/dL (ref 0–149)
VLDL Cholesterol Cal: 19 mg/dL (ref 5–40)

## 2023-02-23 LAB — VITAMIN D 25 HYDROXY (VIT D DEFICIENCY, FRACTURES): Vit D, 25-Hydroxy: 23.3 ng/mL — ABNORMAL LOW (ref 30.0–100.0)

## 2023-02-23 LAB — HEMOGLOBIN A1C
Est. average glucose Bld gHb Est-mCnc: 114 mg/dL
Hgb A1c MFr Bld: 5.6 % (ref 4.8–5.6)

## 2023-02-23 LAB — HEPATITIS C ANTIBODY: Hep C Virus Ab: NONREACTIVE

## 2023-02-23 LAB — HIV ANTIBODY (ROUTINE TESTING W REFLEX): HIV Screen 4th Generation wRfx: NONREACTIVE

## 2023-02-24 ENCOUNTER — Encounter: Payer: Self-pay | Admitting: Family Medicine

## 2023-03-01 ENCOUNTER — Ambulatory Visit (INDEPENDENT_AMBULATORY_CARE_PROVIDER_SITE_OTHER): Payer: Medicaid Other | Admitting: Family Medicine

## 2023-03-01 ENCOUNTER — Encounter: Payer: Self-pay | Admitting: Family Medicine

## 2023-03-01 ENCOUNTER — Other Ambulatory Visit (HOSPITAL_COMMUNITY)
Admission: RE | Admit: 2023-03-01 | Discharge: 2023-03-01 | Disposition: A | Discharge status: Home / Self Care | Payer: Medicaid Other | Source: Ambulatory Visit | Attending: Family Medicine | Admitting: Family Medicine

## 2023-03-01 VITALS — BP 135/85 | HR 73 | Temp 98.8°F | Ht 65.0 in | Wt 221.0 lb

## 2023-03-01 DIAGNOSIS — Z124 Encounter for screening for malignant neoplasm of cervix: Secondary | ICD-10-CM | POA: Diagnosis not present

## 2023-03-01 DIAGNOSIS — Z0000 Encounter for general adult medical examination without abnormal findings: Secondary | ICD-10-CM

## 2023-03-01 DIAGNOSIS — E559 Vitamin D deficiency, unspecified: Secondary | ICD-10-CM | POA: Insufficient documentation

## 2023-03-01 DIAGNOSIS — N926 Irregular menstruation, unspecified: Secondary | ICD-10-CM | POA: Diagnosis not present

## 2023-03-01 NOTE — Progress Notes (Signed)
 Complete physical exam  Patient: Kayla Nguyen   DOB: [birth year]   22 y.o. Female  MRN: [id number]  Subjective:    Chief Complaint  Patient presents with   Annual Exam    With PAP    Kayla Nguyen is a 24 y.o. female who presents today for a complete physical exam. She reports consuming a new diet and following an exercise routine with her mother that was previously helpful for weight loss. She generally feels well. She reports sleeping well. She notes that she has irregular menstrual cycles.  For 2-3 months at a time, they are very predictable every 28-30 days.  After about 3 months, her next cycle will start around day 14.  She states that her cycles have been like this ever since she began menstruating.  She started Depo injections about 2 months ago.   Most recent fall risk assessment:    03/01/2023   11:02 AM  Fall Risk   Falls in the past year? 0  Number falls in past yr: 0  Injury with Fall? 0  Risk for fall due to : No Fall Risks  Follow up Falls evaluation completed     Most recent depression and anxiety screenings:    03/01/2023   11:02 AM 12/27/2022    1:42 PM  PHQ 2/9 Scores  PHQ - 2 Score 1 1  PHQ- 9 Score 6 5      03/01/2023   11:03 AM 12/27/2022    1:42 PM  GAD 7 : Generalized Anxiety Score  Nervous, Anxious, on Edge 1 1  Control/stop worrying 1 1  Worry too much - different things 1 1  Trouble relaxing 1 1  Restless 1 0  Easily annoyed or irritable 1 1  Afraid - awful might happen 0 1  Total GAD 7 Score 6 6  Anxiety Difficulty Somewhat difficult Somewhat difficult    Patient Active Problem List   Diagnosis Date Noted   Irregular periods 03/01/2023   Vitamin D  deficiency 03/01/2023    History reviewed. No pertinent surgical history. Social History   Tobacco Use   Smoking status: Never    Passive exposure: Never   Smokeless tobacco: Never  Vaping Use   Vaping status: Never Used  Substance Use Topics   Alcohol use: No   Drug use: No    Family History  Problem Relation Age of Onset   Polycythemia Mother    Diabetes Father    Prostate cancer Father    Bladder Cancer Father    Diabetes Sister    Diabetes Paternal Grandfather    No Known Allergies   Patient Care Team: Wallace Joesph LABOR, PA as PCP - General (Family Medicine)   Outpatient Medications Prior to Visit  Medication Sig   medroxyPROGESTERone  (DEPO-PROVERA ) 150 MG/ML injection Inject 1 mL (150 mg total) into the muscle every 3 (three) months.   Facility-Administered Medications Prior to Visit  Medication Dose Route Frequency Provider   medroxyPROGESTERone  (DEPO-PROVERA ) injection 150 mg  150 mg Intramuscular Q90 days Wallace Joesph A, PA    Review of Systems  Constitutional:  Negative for chills, fever and malaise/fatigue.  HENT:  Negative for congestion and hearing loss.   Eyes:  Negative for blurred vision and double vision.  Respiratory:  Negative for cough and shortness of breath.   Cardiovascular:  Negative for chest pain, palpitations and leg swelling.  Gastrointestinal:  Negative for abdominal pain, constipation, diarrhea and heartburn.  Genitourinary:  Negative for frequency and urgency.  Musculoskeletal:  Negative for myalgias and neck pain.  Neurological:  Negative for headaches.  Endo/Heme/Allergies:  Negative for polydipsia.  Psychiatric/Behavioral:  Negative for depression. The patient is not nervous/anxious and does not have insomnia.       Objective:    BP 135/85   Pulse 73   Temp 98.8 F (37.1 C) (Oral)   Ht 5' 5 (1.651 m)   Wt 221 lb (100.2 kg)   SpO2 98%   BMI 36.78 kg/m    Physical Exam Constitutional:      General: She is not in acute distress.    Appearance: Normal appearance.  HENT:     Head: Normocephalic and atraumatic.     Right Ear: Tympanic membrane, ear canal and external ear normal.     Left Ear: Tympanic membrane, ear canal and external ear normal.     Nose: Nose normal.     Mouth/Throat:      Mouth: Mucous membranes are moist.     Pharynx: No oropharyngeal exudate or posterior oropharyngeal erythema.  Eyes:     Extraocular Movements: Extraocular movements intact.     Conjunctiva/sclera: Conjunctivae normal.     Pupils: Pupils are equal, round, and reactive to light.     Comments: Wearing glasses, prescription up to date  Neck:     Thyroid : No thyroid  mass, thyromegaly or thyroid  tenderness.  Cardiovascular:     Rate and Rhythm: Normal rate and regular rhythm.     Heart sounds: Normal heart sounds. No murmur heard.    No friction rub. No gallop.  Pulmonary:     Effort: Pulmonary effort is normal. No respiratory distress.     Breath sounds: Normal breath sounds. No wheezing, rhonchi or rales.  Abdominal:     General: Abdomen is flat. Bowel sounds are normal. There is no distension.     Palpations: There is no mass.     Tenderness: There is no abdominal tenderness. There is no guarding.  Musculoskeletal:        General: Normal range of motion.     Cervical back: Normal range of motion and neck supple.  Lymphadenopathy:     Cervical: No cervical adenopathy.  Skin:    General: Skin is warm and dry.  Neurological:     Mental Status: She is alert and oriented to person, place, and time.     Cranial Nerves: No cranial nerve deficit.     Motor: No weakness.     Deep Tendon Reflexes: Reflexes normal.  Psychiatric:        Mood and Affect: Mood normal.        Assessment & Plan:    Routine Health Maintenance and Physical Exam  Immunization History  Administered Date(s) Administered   DTaP 08/17/2000, 10/20/2000, 12/20/2000, 03/09/2002, 10/20/2004   Hepatitis A, Ped/Adol-2 Dose 10/20/2004   Hepatitis B, PED/ADOLESCENT Mar 20, 2000, 07/19/2000, 03/09/2002   IPV 08/17/2000, 10/20/2000, 03/09/2002, 10/20/2004   MMR 03/09/2002, 10/20/2004   Pneumococcal Conjugate-13 08/17/2000, 10/20/2000, 12/20/2000, 03/09/2002   Tdap 06/18/2018   Varicella 03/09/2002    Health  Maintenance  Topic Date Due   HPV VACCINES (1 - 3-dose series) Never done   Cervical Cancer Screening (Pap smear)  Never done   COVID-19 Vaccine (1 - 2024-25 season) Never done   INFLUENZA VACCINE  05/23/2023 (Originally 09/23/2022)   DTaP/Tdap/Td (7 - Td or Tdap) 06/17/2028   Hepatitis C Screening  Completed   HIV Screening  Completed    Reviewed most recent labs including CBC,  CMP, lipid panel, A1C, TSH, and vitamin D . All within normal limits/stable from last check other than vitamin D  low so recommend supplement, LDL slightly elevated 120. Completed cervical cancer screening with Pap smear today. Provided recommendations for HPV vaccine series.  Discussed health benefits of physical activity, and encouraged her to engage in regular exercise appropriate for her age and condition.  Wellness examination  Screening for cervical cancer -     Cytology - PAP  Irregular periods Assessment & Plan: Recommended keeping a log of period start and stop dates for the next 6 months.  We discussed that cycles can be irregular for up to 12 months even after contraception.  If cycles continue to be irregular after 6 months, evaluate for causes of oligomenorrhea with serum total testosterone, early morning 17-hydroxyprogesterone, and TSH.  Suspicion for PCOS given history of irregular menstrual cycles, BMI 36.78.  Consider pelvic ultrasound.  No presence of hirsutism so does not meet diagnostic [place] criteria as of now.   Vitamin D  deficiency Assessment & Plan: Continue over-the-counter vitamin D  2000 unit daily supplement.     Return in 23 days (on 03/24/2023) for follow-up as scheduled.     Joesph DELENA Sear, PA

## 2023-03-01 NOTE — Assessment & Plan Note (Signed)
 Continue over-the-counter vitamin D 2000 unit daily supplement.

## 2023-03-01 NOTE — Assessment & Plan Note (Signed)
 Recommended keeping a log of period start and stop dates for the next 6 months.  We discussed that cycles can be irregular for up to 12 months even after contraception.  If cycles continue to be irregular after 6 months, evaluate for causes of oligomenorrhea with serum total testosterone, early morning 17-hydroxyprogesterone, and TSH.  Suspicion for PCOS given history of irregular menstrual cycles, BMI 36.78.  Consider pelvic ultrasound.  No presence of hirsutism so does not meet diagnostic [place] criteria as of now.

## 2023-03-01 NOTE — Patient Instructions (Addendum)
 You did fantastic today!  We should get the results of your Pap smear within the next few days.  As long as everything comes back okay, you will not need another Pap smear for 3 years.    IRREGULAR PERIODS: Keep a log of your period start and stop dates for the next 6 months or so.  I would like to closely monitor this.  If it continues to be irregular, next steps will be to check some hormone labs and potentially get an ultrasound to look at the ovaries.  Hopefully, they will normalize now that you for a few months into your Depo injections.  PODCAST with great info: The Obesity Guide with Matthea Rentea Why you don't need a goal weight  https://open.spotify.com/episode/4nAX44YWGRCkIvVM0FpwuD?si=[email or web address] BMI history, flaws, and better measures of overall health https://open.spotify.com/episode/87fqjGthbA46DU3b5KvzN6?si=[email or web address]

## 2023-03-03 LAB — CYTOLOGY - PAP: Diagnosis: NEGATIVE

## 2023-03-04 ENCOUNTER — Other Ambulatory Visit: Payer: Self-pay

## 2023-03-04 ENCOUNTER — Emergency Department
Admission: EM | Admit: 2023-03-04 | Discharge: 2023-03-04 | Disposition: A | Discharge status: Home / Self Care | Payer: Worker's Compensation | Attending: Emergency Medicine | Admitting: Emergency Medicine

## 2023-03-04 ENCOUNTER — Emergency Department: Payer: Worker's Compensation

## 2023-03-04 DIAGNOSIS — S93401A Sprain of unspecified ligament of right ankle, initial encounter: Secondary | ICD-10-CM | POA: Diagnosis not present

## 2023-03-04 DIAGNOSIS — Y990 Civilian activity done for income or pay: Secondary | ICD-10-CM | POA: Diagnosis not present

## 2023-03-04 DIAGNOSIS — M25571 Pain in right ankle and joints of right foot: Secondary | ICD-10-CM | POA: Diagnosis present

## 2023-03-04 DIAGNOSIS — X501XXA Overexertion from prolonged static or awkward postures, initial encounter: Secondary | ICD-10-CM | POA: Diagnosis not present

## 2023-03-04 NOTE — ED Provider Notes (Signed)
 [facility] Emergency Department Provider Note     Event Date/Time   First MD Initiated Contact with Patient 03/04/23 1824     (approximate)   History   Ankle Pain   HPI  Kayla Nguyen is a 23 y.o. female presents to the ED from work following a fall sustaining right ankle pain.  Patient reports she believes she twisted her ankle by tripping over a wooden ramp.  She reports pain on the lateral aspect.  Patient is limping.  No other complaints.     Physical Exam   Triage Vital Signs: ED Triage Vitals  Encounter Vitals Group     BP 03/04/23 1812 135/81     Systolic BP Percentile --      Diastolic BP Percentile --      Pulse Rate 03/04/23 1812 88     Resp 03/04/23 1812 18     Temp 03/04/23 1812 97.7 F (36.5 C)     Temp Source 03/04/23 1812 Oral     SpO2 03/04/23 1812 100 %     Weight 03/04/23 1813 218 lb (98.9 kg)     Height 03/04/23 1813 5' 5 (1.651 m)     Head Circumference --      Peak Flow --      Pain Score 03/04/23 1813 6     Pain Loc --      Pain Education --      Exclude from Growth Chart --     Most recent vital signs: Vitals:   03/04/23 1812  BP: 135/81  Pulse: 88  Resp: 18  Temp: 97.7 F (36.5 C)  SpO2: 100%    General Awake, no distress.  HEENT NCAT. PERRL. EOMI. No rhinorrhea. Mucous membranes are moist.  CV:  Good peripheral perfusion.  RESP:  Normal effort.  ABD:  No distention.  Other:  Right foot reveals edema over lateral malleolus.  Neurovascular status intact.  Full range of motion of all digits.  Capillary refills brisk and normal.   ED Results / Procedures / Treatments   Labs (all labs ordered are listed, but only abnormal results are displayed) Labs Reviewed - No data to display  RADIOLOGY  I personally viewed and evaluated these images as part of my medical decision making, as well as reviewing the written report by the radiologist.  ED Provider Interpretation: Right ankle x-ray appears normal.   No acute bony abnormality.  Will confirm with final radiology read.  DG Ankle Complete Right Result Date: 03/04/2023 CLINICAL DATA:  Fall, twisted right ankle. Felt a pop. Limited range of motion. EXAM: RIGHT ANKLE - COMPLETE 3+ VIEW COMPARISON:  None Available. FINDINGS: There is no evidence of fracture, dislocation, or joint effusion. The ankle mortise is preserved. There is no evidence of arthropathy or other focal bone abnormality. Soft tissue edema more prominent laterally. IMPRESSION: Soft tissue edema. No fracture or subluxation of the right ankle. Electronically Signed   By: Andrea Gasman M.D.   On: 03/04/2023 18:46   PROCEDURES:  Critical Care performed: No  Procedures  MEDICATIONS ORDERED IN ED: Medications - No data to display  IMPRESSION / MDM / ASSESSMENT AND PLAN / ED COURSE  I reviewed the triage vital signs and the nursing notes.                                 23 y.o. female presents to the emergency  department for evaluation and treatment of ankle pain. See HPI for further details.   Differential diagnosis includes, but is not limited to fracture, dislocation, sprain  Patient's presentation is most consistent with acute complicated illness / injury requiring diagnostic workup.  Patient is alert and oriented.  She is hemodynamic stable.  Physical exam findings are pertinent for swelling over the lateral malleolus.  X-rays are reassuring.  Patient is placed in ankle brace and provided crutches.  Encouraged nonweightbearing status for 1 week with gradual weightbearing as tolerated with the assistance of crutches.  RICE therapy education provided.  Tylenol and ibuprofen  for pain as needed.  Encouraged to follow-up with orthopedic if symptoms do not improve.  Patient is a stable condition for discharge home.  ED return precautions discussed.   FINAL CLINICAL IMPRESSION(S) / ED DIAGNOSES   Final diagnoses:  Sprain of right ankle, unspecified ligament, initial  encounter   Rx / DC Orders   ED Discharge Orders     None       Note:  This document was prepared using Dragon voice recognition software and may include unintentional dictation errors.    Margrette, Hazelene Doten A, PA-C 03/04/23 1859    Floy Roberts, MD 03/04/23 [phone]

## 2023-03-04 NOTE — ED Triage Notes (Addendum)
 Coming from work, Pt reports twisting R ankle and falling down. Pt states she felt a pop. Pt reports limited movement to R ankle. Unable to assess for swelling, Pt still has shoe on. GCS 15

## 2023-03-04 NOTE — Discharge Instructions (Addendum)
 Your evaluated in the ED for an ankle injury.  Your x-ray does not show a broken bone or dislocation.  There is some soft tissue swelling so we will place you in a ankle brace and you are encouraged to use crutches for 1 week on nonweightbearing status.  After 1 weeek you are encouraged to weight-bear as tolerated gradually adding weight to your foot with the assistance of these crutches.  In the interim take Tylenol ibuprofen  for pain.  Get plenty of rest.  Apply ice to the affected area to help with swelling.  Elevate the affected limb on 3-4 pillows at night.  If symptoms do not improve please follow-up with orthopedic.

## 2023-03-04 NOTE — ED Notes (Signed)
 Verbal consent for WC drug screen to be performed at [facility] obtained by this RN from [employer] pt's manager at [employer] [phone]. Previous RN on dayshift also spoke with manager regarding the [facility] situation.

## 2023-03-04 NOTE — ED Notes (Signed)
 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 [employer] from [place] is Engineer, site and can be reached at [phone].

## 2023-03-17 ENCOUNTER — Other Ambulatory Visit: Payer: Self-pay

## 2023-03-17 ENCOUNTER — Ambulatory Visit
Admission: RE | Admit: 2023-03-17 | Discharge: 2023-03-17 | Disposition: A | Discharge status: Home / Self Care | Payer: Medicaid Other | Source: Ambulatory Visit | Attending: Internal Medicine | Admitting: Internal Medicine

## 2023-03-17 ENCOUNTER — Telehealth: Payer: Medicaid Other

## 2023-03-17 ENCOUNTER — Ambulatory Visit: Payer: Self-pay | Admitting: Family Medicine

## 2023-03-17 VITALS — BP 109/73 | HR 113 | Temp 99.1°F | Resp 18

## 2023-03-17 DIAGNOSIS — J101 Influenza due to other identified influenza virus with other respiratory manifestations: Secondary | ICD-10-CM | POA: Diagnosis not present

## 2023-03-17 LAB — POCT INFLUENZA A/B
Influenza A, POC: POSITIVE — AB
Influenza B, POC: NEGATIVE

## 2023-03-17 MED ORDER — PROMETHAZINE-DM 6.25-15 MG/5ML PO SYRP: 5.0000 mL | ORAL_SOLUTION | Freq: Every evening | ORAL | 0 refills | Status: DC | PRN

## 2023-03-17 MED ORDER — IBUPROFEN 100 MG/5ML PO SUSP
400.0000 mg | Freq: Once | ORAL | Status: AC
  Administered 2023-03-17: 400 mg via ORAL

## 2023-03-17 MED ORDER — OSELTAMIVIR PHOSPHATE 75 MG PO CAPS: 75.0000 mg | ORAL_CAPSULE | Freq: Two times a day (BID) | ORAL | 0 refills | Status: DC

## 2023-03-17 NOTE — Telephone Encounter (Signed)
Chief Complaint: SOB Symptoms: Difficulty breathing, cough, congestion, phlegm. Frequency: Constant Pertinent Negatives: Patient denies current fevers, wheezing, chest pain. Disposition: [] ED /[x] Urgent Care (no appt availability in office) / [] Appointment(In office/virtual)/ []  [facility]/ [] Home Care/ [] Refused Recommended Disposition /[] [facility]/ []  Follow-up with PCP Additional Notes: Patient complains of SOB since this AM with fast HR. Patient states HR has since returned to normal but difficult breathing is still present--mild at rest, moderate with activity. Patient frequently described respiratory symptoms as a "heavy feeling." Patient denying current fevers at this time, chest pain, and wheezing. Patient has no pertinent medical hx. Patient states that she took theraflu a few hours earlier which helped with her symptoms some but symptoms are recurring with the wearing off of medication. Patient advised by this RN to be seen in UC within 4 hours per protocol as nothing available in the office. Patient agreeable and attempted to be scheduled at [facility], whoever patient stated that she needed to hang up to answer another important call and gave mom's number [phone]. Number attempted to be called but went straight to voicemail and voicemail is full. Patient needs called back to be scheduled with UC by 430pm.  Copied from CRM [id number]. Topic: Clinical - Red Word Triage >> Mar 17, 2023 12:15 PM Fredrica W wrote: Red Word that prompted transfer to Nurse Triage: Hard to breath, constant cough, heaviness  Reason for Disposition  [1] MILD difficulty breathing (e.g., minimal/no SOB at rest, SOB with walking, pulse <100) AND [2] NEW-onset or WORSE than normal  Answer Assessment - Initial Assessment Questions 1. RESPIRATORY STATUS: "Describe your breathing?" (e.g., wheezing, shortness of breath, unable to speak, severe coughing)      "I can breathe, but I am just having to  put more effort into it." "It's just like a heavy feeling." 2. ONSET: "When did this breathing problem begin?"      This morning. 3. PATTERN "Does the difficult breathing come and go, or has it been constant since it started?"      Constant  4. SEVERITY: "How bad is your breathing?" (e.g., mild, moderate, severe)    - MILD: No SOB at rest, mild SOB with walking, speaks normally in sentences, can lie down, no retractions, pulse < 100.    - MODERATE: SOB at rest, SOB with minimal exertion and prefers to sit, cannot lie down flat, speaks in phrases, mild retractions, audible wheezing, pulse 100-120.    - SEVERE: Very SOB at rest, speaks in single words, struggling to breathe, sitting hunched forward, retractions, pulse > 120      Mild with rest, harder when walking/activity. 5. RECURRENT SYMPTOM: "Have you had difficulty breathing before?" If Yes, ask: "When was the last time?" and "What happened that time?"      Denies 6. CARDIAC HISTORY: "Do you have any history of heart disease?" (e.g., heart attack, angina, bypass surgery, angioplasty)      Denies 7. LUNG HISTORY: "Do you have any history of lung disease?"  (e.g., pulmonary embolus, asthma, emphysema)     Denies 8. CAUSE: "What do you think is causing the breathing problem?"      "Might be the flu" 9. OTHER SYMPTOMS: "Do you have any other symptoms? (e.g., dizziness, runny nose, cough, chest pain, fever)     "I felt a little feverish earlier, but I would say like in the 99s now. I dont have a thermometer." Dry cough, congestion 11. PREGNANCY: "Is there any chance you are  pregnant?" "When was your last menstrual period?"       Denies 70. TRAVEL: "Have you traveled out of the country in the last month?" (e.g., travel history, exposures)       Denies.  Protocols used: Breathing Difficulty-A-AH

## 2023-03-17 NOTE — ED Triage Notes (Signed)
Complains of cough, difficulty breathing and congestion.  Symptoms started this morning.  Denies a fever.    Took theraflu this morning

## 2023-03-17 NOTE — Telephone Encounter (Signed)
Called patient to make an urgent care appointment. Please see previous note for full triage. Appointment made for urgent care later today.

## 2023-03-17 NOTE — ED Provider Notes (Signed)
[name]    CSN: [id number] Arrival date & time: 03/17/23  1639      History   Chief Complaint Chief Complaint  Patient presents with   Cough    Cough, difficulty breathing, congestion - Entered by patient   Appointment    16:30    HPI Kayla Nguyen is a 23 y.o. female.   Kayla Nguyen is a 23 y.o. female presenting for chief complaint of cough, chest congestion, sore throat, nasal congestion, and generalized fatigue that started this morning.  Unsure of fever at home but reports chills.  She had a little bit of shortness of breath this morning on waking with cough but has improved slightly throughout the day.  Denies nausea, vomiting, diarrhea, abdominal pain, rash, and dizziness.  She had a headache last night but this went away.  Never smoker, denies drug use.  Denies history of chronic respiratory problems like asthma.  No recent sick contacts with similar symptoms reported. Taking OTC medicines with some relief.    Cough   History reviewed. No pertinent past medical history.  Patient Active Problem List   Diagnosis Date Noted   Irregular periods 03/01/2023   Vitamin D deficiency 03/01/2023    History reviewed. No pertinent surgical history.  OB History   No obstetric history on file.      Home Medications    Prior to Admission medications   Medication Sig Start Date End Date Taking? Authorizing Provider  oseltamivir (TAMIFLU) 75 MG capsule Take 1 capsule (75 mg total) by mouth every 12 (twelve) hours. 03/17/23  Yes Carlisle Beers, FNP  medroxyPROGESTERone (DEPO-PROVERA) 150 MG/ML injection Inject 1 mL (150 mg total) into the muscle every 3 (three) months. 12/27/22   Melida Quitter, PA    Family History Family History  Problem Relation Age of Onset   Polycythemia Mother    Diabetes Father    Prostate cancer Father    Bladder Cancer Father    Diabetes Sister    Diabetes Paternal Grandfather     Social History Social History    Tobacco Use   Smoking status: Never    Passive exposure: Never   Smokeless tobacco: Never  Vaping Use   Vaping status: Never Used  Substance Use Topics   Alcohol use: No   Drug use: No     Allergies   Patient has no known allergies.   Review of Systems Review of Systems  Respiratory:  Positive for cough.   Per HPI   Physical Exam Triage Vital Signs ED Triage Vitals  Encounter Vitals Group     BP 03/17/23 1733 109/73     Systolic BP Percentile --      Diastolic BP Percentile --      Pulse Rate 03/17/23 1733 (!) 113     Resp 03/17/23 1733 18     Temp 03/17/23 1733 99.1 F (37.3 C)     Temp Source 03/17/23 1733 Oral     SpO2 03/17/23 1733 99 %     Weight --      Height --      Head Circumference --      Peak Flow --      Pain Score 03/17/23 1730 0     Pain Loc --      Pain Education --      Exclude from Growth Chart --    No data found.  Updated Vital Signs BP 109/73 (BP Location: Right Arm) Comment (BP Location):  large cuff  Pulse (!) 113 Comment: coughing  Temp 99.1 F (37.3 C) (Oral)   Resp 18   LMP 03/01/2023   SpO2 99%   Visual Acuity Right Eye Distance:   Left Eye Distance:   Bilateral Distance:    Right Eye Near:   Left Eye Near:    Bilateral Near:     Physical Exam Vitals and nursing note reviewed.  Constitutional:      Appearance: She is not ill-appearing or toxic-appearing.  HENT:     Head: Normocephalic and atraumatic.     Right Ear: Hearing, tympanic membrane, ear canal and external ear normal.     Left Ear: Hearing, tympanic membrane, ear canal and external ear normal.     Nose: Congestion present.     Mouth/Throat:     Lips: Pink.     Mouth: Mucous membranes are moist. No injury or oral lesions.     Dentition: Normal dentition.     Tongue: No lesions.     Pharynx: Oropharynx is clear. Uvula midline. No pharyngeal swelling, oropharyngeal exudate, posterior oropharyngeal erythema, uvula swelling or postnasal drip.      Tonsils: No tonsillar exudate.  Eyes:     General: Lids are normal. Vision grossly intact. Gaze aligned appropriately.     Extraocular Movements: Extraocular movements intact.     Conjunctiva/sclera: Conjunctivae normal.  Neck:     Trachea: Trachea and phonation normal.  Cardiovascular:     Rate and Rhythm: Normal rate and regular rhythm.     Heart sounds: Normal heart sounds, S1 normal and S2 normal.  Pulmonary:     Effort: Pulmonary effort is normal. No respiratory distress.     Breath sounds: Normal breath sounds and air entry.  Musculoskeletal:     Cervical back: Neck supple.  Lymphadenopathy:     Cervical: No cervical adenopathy.  Skin:    General: Skin is warm and dry.     Capillary Refill: Capillary refill takes less than 2 seconds.     Findings: No rash.  Neurological:     General: No focal deficit present.     Mental Status: She is alert and oriented to person, place, and time. Mental status is at baseline.     Cranial Nerves: No dysarthria or facial asymmetry.  Psychiatric:        Mood and Affect: Mood normal.        Speech: Speech normal.        Behavior: Behavior normal.        Thought Content: Thought content normal.        Judgment: Judgment normal.      UC Treatments / Results  Labs (all labs ordered are listed, but only abnormal results are displayed) Labs Reviewed  POCT INFLUENZA A/B    EKG   Radiology No results found.  Procedures Procedures (including critical care time)  Medications Ordered in UC Medications  ibuprofen (ADVIL) 100 MG/5ML suspension 400 mg (400 mg Oral Given 03/17/23 1752)    Initial Impression / Assessment and Plan / UC Course  I have reviewed the triage vital signs and the nursing notes.  Pertinent labs & imaging results that were available during my care of the patient were reviewed by me and considered in my medical decision making (see chart for details).   1. Influenza A Flu A point of care testing positive. Lungs  clear, vitals hemodynamically stable, therefore deferred imaging. Interventions in clinic: ibuprofen for low grade temp. HR elevated likely  secondary to low grade fever. Offered antiviral given timing of illness, Tamiflu sent to pharmacy.  Recommend supportive care for further symptomatic relief as outlined in AVS.  Modes of transmission, quarantine recommendations, and hand hygiene discussed.  Counseled patient on potential for adverse effects with medications prescribed/recommended today, strict ER and return-to-clinic precautions discussed, patient verbalized understanding.    Final Clinical Impressions(s) / UC Diagnoses   Final diagnoses:  Influenza A     Discharge Instructions      You have the flu. Take Tamiflu every 12 hours for the next 5 days to improve symptoms and stop the virus from replicating in your body. Ibuprofen/tylenol as needed for fevers and body aches. Mucinex as needed for nasal congestion. Promethazine DM at bedtime as needed for cough.      ED Prescriptions     Medication Sig Dispense Auth. Provider   oseltamivir (TAMIFLU) 75 MG capsule Take 1 capsule (75 mg total) by mouth every 12 (twelve) hours. 10 capsule Carlisle Beers, FNP      PDMP not reviewed this encounter.   Carlisle Beers, [place] 03/17/23 [phone]

## 2023-03-17 NOTE — Discharge Instructions (Addendum)
You have the flu. Take Tamiflu every 12 hours for the next 5 days to improve symptoms and stop the virus from replicating in your body. Ibuprofen/tylenol as needed for fevers and body aches. Mucinex as needed for nasal congestion. Promethazine DM at bedtime as needed for cough.

## 2023-03-20 ENCOUNTER — Ambulatory Visit (INDEPENDENT_AMBULATORY_CARE_PROVIDER_SITE_OTHER): Payer: Medicaid Other | Admitting: Radiology

## 2023-03-20 ENCOUNTER — Other Ambulatory Visit: Payer: Self-pay

## 2023-03-20 ENCOUNTER — Ambulatory Visit
Admission: EM | Admit: 2023-03-20 | Discharge: 2023-03-20 | Disposition: A | Discharge status: Home / Self Care | Payer: Medicaid Other | Attending: Family Medicine | Admitting: Family Medicine

## 2023-03-20 DIAGNOSIS — M546 Pain in thoracic spine: Secondary | ICD-10-CM | POA: Diagnosis not present

## 2023-03-20 DIAGNOSIS — J09X2 Influenza due to identified novel influenza A virus with other respiratory manifestations: Secondary | ICD-10-CM | POA: Diagnosis not present

## 2023-03-20 NOTE — Discharge Instructions (Addendum)
Continue your Tamiflu and cough medicine Over-the-counter ibuprofen 2 tablets every 6 hours with food as needed for pain The x-ray reading we discussed is preliminary. Your x-ray will be read by a radiologist in next few hours. If there is a discrepancy, you will be contacted, and instructed on a new plan for you care.  Follow-up for new, worsening symptoms or concerns Go to the emergency department for development of high fever, shaking chills, worsening pain, worsening shortness of breath, concerns

## 2023-03-20 NOTE — ED Provider Notes (Signed)
[name]    CSN: [id number] Arrival date & time: 03/20/23  1450      History   Chief Complaint Chief Complaint  Patient presents with   Back Pain    HPI Kayla Nguyen is a 23 y.o. female.   The history is provided by the patient.  Back Pain Associated symptoms: chest pain, fever and headaches    Has influenza A tested positive here 3 days ago, continues with flu symptoms but now has back pain left side near shoulder blade pain comes and goes, can last up to an hour, does not radiate still but sharp at times.  Not worsened by cough, breathing, activity or position.  Admits soreness in her chest when she coughs and shortness of breath when she coughs. Had fever at onset of illness none x 2 days.  History reviewed. No pertinent past medical history.  Patient Active Problem List   Diagnosis Date Noted   Irregular periods 03/01/2023   Vitamin D deficiency 03/01/2023    History reviewed. No pertinent surgical history.  OB History   No obstetric history on file.      Home Medications    Prior to Admission medications   Medication Sig Start Date End Date Taking? Authorizing Provider  medroxyPROGESTERone (DEPO-PROVERA) 150 MG/ML injection Inject 1 mL (150 mg total) into the muscle every 3 (three) months. 12/27/22   Melida Quitter, PA  oseltamivir (TAMIFLU) 75 MG capsule Take 1 capsule (75 mg total) by mouth every 12 (twelve) hours. 03/17/23   Carlisle Beers, FNP  promethazine-dextromethorphan (PROMETHAZINE-DM) 6.25-15 MG/5ML syrup Take 5 mLs by mouth at bedtime as needed for cough. 03/17/23   Carlisle Beers, FNP    Family History Family History  Problem Relation Age of Onset   Polycythemia Mother    Diabetes Father    Prostate cancer Father    Bladder Cancer Father    Diabetes Sister    Diabetes Paternal Grandfather     Social History Social History   Tobacco Use   Smoking status: Never    Passive exposure: Never   Smokeless  tobacco: Never  Vaping Use   Vaping status: Never Used  Substance Use Topics   Alcohol use: No   Drug use: No     Allergies   Patient has no known allergies.   Review of Systems Review of Systems  Constitutional:  Positive for fatigue and fever.  HENT:  Positive for congestion, rhinorrhea and sore throat.   Respiratory:  Positive for cough and shortness of breath. Negative for chest tightness and wheezing.   Cardiovascular:  Positive for chest pain.  Gastrointestinal:  Negative for diarrhea and vomiting.  Musculoskeletal:  Positive for back pain.  Neurological:  Positive for headaches.     Physical Exam Triage Vital Signs ED Triage Vitals  Encounter Vitals Group     BP 03/20/23 1503 139/85     Systolic BP Percentile --      Diastolic BP Percentile --      Pulse Rate 03/20/23 1503 88     Resp 03/20/23 1503 17     Temp 03/20/23 1503 97.9 F (36.6 C)     Temp Source 03/20/23 1503 Oral     SpO2 03/20/23 1503 97 %     Weight 03/20/23 1502 212 lb (96.2 kg)     Height 03/20/23 1502 5\' 5"  (1.651 m)     Head Circumference --      Peak Flow --  Pain Score 03/20/23 1501 5     Pain Loc --      Pain Education --      Exclude from Growth Chart --    No data found.  Updated Vital Signs BP 139/85 (BP Location: Right Arm)   Pulse 88   Temp 97.9 F (36.6 C) (Oral)   Resp 17   Ht 5\' 5"  (1.651 m)   Wt 212 lb (96.2 kg)   LMP 03/01/2023 (Exact Date)   SpO2 97%   BMI 35.28 kg/m   Visual Acuity Right Eye Distance:   Left Eye Distance:   Bilateral Distance:    Right Eye Near:   Left Eye Near:    Bilateral Near:     Physical Exam Vitals and nursing note reviewed.  Constitutional:      Appearance: She is not ill-appearing or toxic-appearing.     Comments: Walking boot right foot  HENT:     Head: Normocephalic and atraumatic.     Right Ear: Tympanic membrane and ear canal normal.     Left Ear: Tympanic membrane and ear canal normal.     Nose: Congestion  present. No rhinorrhea.     Mouth/Throat:     Mouth: Mucous membranes are moist.     Pharynx: Oropharynx is clear. No oropharyngeal exudate or posterior oropharyngeal erythema.  Eyes:     Conjunctiva/sclera: Conjunctivae normal.  Cardiovascular:     Rate and Rhythm: Normal rate and regular rhythm.     Heart sounds: Normal heart sounds.  Pulmonary:     Effort: Pulmonary effort is normal. No respiratory distress.     Breath sounds: Normal breath sounds. No wheezing, rhonchi or rales.  Musculoskeletal:     Cervical back: Neck supple.     Thoracic back: Tenderness present. No bony tenderness.       Back:  Skin:    General: Skin is warm and dry.  Neurological:     Mental Status: She is alert.      UC Treatments / Results  Labs (all labs ordered are listed, but only abnormal results are displayed) Labs Reviewed - No data to display  EKG   Radiology No results found.  Procedures Procedures (including critical care time)  Medications Ordered in UC Medications - No data to display  Initial Impression / Assessment and Plan / UC Course  I have reviewed the triage vital signs and the nursing notes.  Pertinent labs & imaging results that were available during my care of the patient were reviewed by me and considered in my medical decision making (see chart for details).     23 year old female diagnosed with flu 3 days ago presents today with back pain, she was worried she may have pneumonia.  Continues with cough, has soreness in her chest and shortness of breath when she coughs.  No recent fevers.  She urine, nontoxic, vital signs are stable.  Lungs are clear to auscultation she has mild musculoskeletal back tenderness.  Chest x-ray obtained independently viewed by me no evidence of infiltrate.Will contact patient if there is a radiologic discrepancy.  Recommend OTC ibuprofen 400 mg 4 times daily with food for pain.  Follow-up for new, worsening symptoms or concerns Final  Clinical Impressions(s) / UC Diagnoses   Final diagnoses:  Influenza due to identified novel influenza A virus with other respiratory manifestations   Discharge Instructions   None    ED Prescriptions   None    PDMP not reviewed this encounter.  Meliton Rattan, [place] 03/20/23 1554

## 2023-03-20 NOTE — ED Triage Notes (Signed)
Pt presents with complaints of left-sided back pain x 1 day. Pt states she was seen here about three days ago on 1/23 and diagnosed with Flu. Pt voices concerns of the possibility of pneumonia. Pt currently rates her back pain a 5/10. Pt has only been taking the prescribed Tamiflu and cough syrup with improvement.

## 2023-03-24 ENCOUNTER — Ambulatory Visit: Payer: No Typology Code available for payment source

## 2023-03-26 DIAGNOSIS — Z419 Encounter for procedure for purposes other than remedying health state, unspecified: Secondary | ICD-10-CM | POA: Diagnosis not present

## 2023-03-28 ENCOUNTER — Ambulatory Visit: Payer: No Typology Code available for payment source

## 2023-03-29 ENCOUNTER — Ambulatory Visit: Payer: No Typology Code available for payment source

## 2023-03-31 ENCOUNTER — Encounter: Payer: Self-pay | Admitting: Family Medicine

## 2023-03-31 ENCOUNTER — Ambulatory Visit: Payer: Medicaid Other

## 2023-03-31 VITALS — BP 115/77 | HR 79 | Ht 65.0 in | Wt 214.1 lb

## 2023-03-31 DIAGNOSIS — Z3042 Encounter for surveillance of injectable contraceptive: Secondary | ICD-10-CM

## 2023-03-31 MED ORDER — MEDROXYPROGESTERONE ACETATE 150 MG/ML IM SUSP
150.0000 mg | Freq: Once | INTRAMUSCULAR | Status: AC
  Administered 2023-03-31: 150 mg via INTRAMUSCULAR

## 2023-03-31 NOTE — Progress Notes (Signed)
 Patient is here for a Depo-Provera  injection 150 mg IM given Denies chest pain, SHOB headaches, mood changes, or problems with medication. Injection administered in Left Ventrogluteal  Pt tolerated well w/o complications Pt advised to schedule next injection in 12 weeks.

## 2023-04-23 DIAGNOSIS — Z419 Encounter for procedure for purposes other than remedying health state, unspecified: Secondary | ICD-10-CM | POA: Diagnosis not present

## 2023-05-09 DIAGNOSIS — R599 Enlarged lymph nodes, unspecified: Secondary | ICD-10-CM | POA: Diagnosis not present

## 2023-05-09 DIAGNOSIS — M5441 Lumbago with sciatica, right side: Secondary | ICD-10-CM | POA: Diagnosis not present

## 2023-05-10 ENCOUNTER — Ambulatory Visit: Payer: Self-pay | Admitting: Family Medicine

## 2023-05-10 NOTE — Telephone Encounter (Signed)
  Chief Complaint: neck pain and swollen lymph node Symptoms: sore throat, neck pain, lymph node swelling on sides of neck, upper back/shoulder aches, lower back pain Frequency: x 5-6 days Pertinent Negatives: Patient denies fever, difficulty breathing, difficulty swallowing, redness to neck, numbness or weakness to arms or legs, Disposition: [] ED /[] Urgent Care (no appt availability in office) / [x] Appointment(In office/virtual)/ []  [facility]/ [] Home Care/ [] Refused Recommended Disposition /[] [place] [facility]/ []  Follow-up with PCP Additional Notes: Patient states 5-6 days ago she had "knots" in her neck. She states the right side is more swollen, about the size of a nickel. She states her neck feels swollen and stiff. She states her cheeks are red and warm to touch. Muscle aches in both shoulders; sore throat yesterday. She states she went to urgent care on 05/09/23 and was tested for mono and it was negative. They told her they did not think it was meningitis. Patient states she has been under a lot of stress (her father passed away 3 weeks ago and she has her leg in a boot due to a worker's comp issue). Patient agreeable to acute visit tomorrow with [facility].  Copied from CRM [id number]. Topic: Clinical - Red Word Triage >> May 10, 2023 11:52 AM Tiffany B wrote: Red Word that prompted transfer to Nurse Triage:  Patient was seen at the urgent on 05/09/2023 and states symptoms have gotten worsed, patient states she's experiencing neck stiffness and lymph nodes are swollen. Reason for Disposition  [1] MODERATE neck pain (e.g., interferes with normal activities) AND [2] present > 3 days  Answer Assessment - Initial Assessment Questions 1. ONSET: "When did the pain begin?"      X 5-6 days.  2. LOCATION: "Where does it hurt?"      Swollen on both sides but right causes the most discomfort.  3. PATTERN "Does the pain come and go, or has it been constant since it started?"       Comes and goes, this morning has been more constant.  4. SEVERITY: "How bad is the pain?"  (Scale 1-10; or mild, moderate, severe)   - NO PAIN (0): no pain or only slight stiffness    - MILD (1-3): doesn't interfere with normal activities    - MODERATE (4-7): interferes with normal activities or awakens from sleep    - SEVERE (8-10):  excruciating pain, unable to do any normal activities      5-6/10, discomfort.  5. RADIATION: "Does the pain go anywhere else, shoot into your arms?"     Radiates to head.  6. CORD SYMPTOMS: "Any weakness or numbness of the arms or legs?"     Denies.  7. CAUSE: "What do you think is causing the neck pain?"     Unsure.  8. NECK OVERUSE: "Any recent activities that involved turning or twisting the neck?"     Denies.  9. OTHER SYMPTOMS: "Do you have any other symptoms?" (e.g., headache, fever, chest pain, difficulty breathing, neck swelling)     Upper shoulder back pain and lower back pain.  10. PREGNANCY: "Is there any chance you are pregnant?" "When was your last menstrual period?"       EAV:[id number] 2024, on Depo shots.  Protocols used: Neck Pain or Stiffness-A-AH

## 2023-05-11 ENCOUNTER — Encounter: Payer: Self-pay | Admitting: Family Medicine

## 2023-05-11 ENCOUNTER — Ambulatory Visit (INDEPENDENT_AMBULATORY_CARE_PROVIDER_SITE_OTHER): Admitting: Family Medicine

## 2023-05-11 VITALS — BP 118/76 | HR 80 | Temp 99.1°F | Ht 65.0 in | Wt 212.6 lb

## 2023-05-11 DIAGNOSIS — M542 Cervicalgia: Secondary | ICD-10-CM

## 2023-05-11 DIAGNOSIS — M2550 Pain in unspecified joint: Secondary | ICD-10-CM

## 2023-05-11 DIAGNOSIS — I889 Nonspecific lymphadenitis, unspecified: Secondary | ICD-10-CM | POA: Diagnosis not present

## 2023-05-11 NOTE — Progress Notes (Signed)
 Patient ID: Kayla Nguyen, female    DOB: Jul 07, 2000, 23 y.o.   MRN: [id number]  This visit was conducted in person.  BP 118/76   Pulse 80   Temp 99.1 F (37.3 C) (Oral)   Ht 5\' 5"  (1.651 m)   Wt 212 lb 9.6 oz (96.4 kg)   SpO2 96%   BMI 35.38 kg/m    CC:  Chief Complaint  Patient presents with   swollen lymph     Occurred 5 days ago, also occurs in her jaw area    Joint stiffness    Wrist, elbow and bend of legs    Redness    Right cheek     Subjective:   HPI: Kayla Nguyen is a 23 y.o. female presenting on 05/11/2023 for swollen lymph  (Occurred 5 days ago, also occurs in her jaw area ), Joint stiffness (Wrist, elbow and bend of legs ), and Redness (Right cheek )   Was feeling  well overall until  New onset  swollen lymph node at back of head x 2  in last 5 days   She has noted increase in size of swelling right in last few days.  2 days ago redness in right cheek.   Has noted some additional swelling in  right posterior auricular and cervical nodes.  Nodes ar tender.   No rash,  No ST   She has noted upper back, shoulder stiffness.  Pain in low back radiating to thigh.  Joint pain in elbows and knees. She has noted left hand more swollen.. no joint redness or swelling.     No bites in last 2 weeks.  No cough, no congestion.  Has been more tired lately   She had Flu in 02/2023.  1 week ago family with viral infection, N/D.    Seen at urgent care on  3/17.  Negative for mono   Given Augmentin BID ( has take 3 doses.)  Start  robaxin, using ibuprofen  600 mg every  twice da day   Relevant past medical, surgical, family and social history reviewed and updated as indicated. Interim medical history since our last visit reviewed. Allergies and medications reviewed and updated. Outpatient Medications Prior to Visit  Medication Sig Dispense Refill   amoxicillin-clavulanate (AUGMENTIN) 875-125 MG tablet Take 1 tablet by mouth 2 (two) times daily.     gabapentin  (NEURONTIN) 300 MG capsule Take 300 mg by mouth once.     methocarbamol (ROBAXIN) 500 MG tablet Take 500 mg by mouth 4 (four) times daily.     medroxyPROGESTERone (DEPO-PROVERA) 150 MG/ML injection Inject 1 mL (150 mg total) into the muscle every 3 (three) months. 1 mL 3   oseltamivir (TAMIFLU) 75 MG capsule Take 1 capsule (75 mg total) by mouth every 12 (twelve) hours. 10 capsule 0   promethazine-dextromethorphan (PROMETHAZINE-DM) 6.25-15 MG/5ML syrup Take 5 mLs by mouth at bedtime as needed for cough. 118 mL 0   Facility-Administered Medications Prior to Visit  Medication Dose Route Frequency Provider Last Rate Last Admin   medroxyPROGESTERone (DEPO-PROVERA) injection 150 mg  150 mg Intramuscular Q90 days Saralyn Pilar A, PA   150 mg at 01/06/23 1349     Per HPI unless specifically indicated in ROS section below Review of Systems  Constitutional:  Positive for fatigue. Negative for fever.  HENT:  Negative for congestion.   Eyes:  Negative for pain.  Respiratory:  Negative for cough and shortness of breath.   Cardiovascular:  Negative for chest pain, palpitations and leg swelling.  Gastrointestinal:  Negative for abdominal pain.  Genitourinary:  Negative for dysuria and vaginal bleeding.  Musculoskeletal:  Positive for arthralgias and neck pain. Negative for back pain.  Neurological:  Negative for syncope, light-headedness and headaches.  Psychiatric/Behavioral:  Negative for dysphoric mood.    Objective:  BP 118/76   Pulse 80   Temp 99.1 F (37.3 C) (Oral)   Ht 5\' 5"  (1.651 m)   Wt 212 lb 9.6 oz (96.4 kg)   SpO2 96%   BMI 35.38 kg/m   Wt Readings from Last 3 Encounters:  05/26/23 209 lb 14.4 oz (95.2 kg)  05/17/23 212 lb (96.2 kg)  05/15/23 212 lb 8.4 oz (96.4 kg)      Physical Exam Constitutional:      General: She is not in acute distress.    Appearance: Normal appearance. She is well-developed. She is not ill-appearing or toxic-appearing.  HENT:     Head:  Normocephalic.     Right Ear: Hearing, tympanic membrane, ear canal and external ear normal. Tympanic membrane is not erythematous, retracted or bulging.     Left Ear: Hearing, tympanic membrane, ear canal and external ear normal. Tympanic membrane is not erythematous, retracted or bulging.     Nose: No mucosal edema or rhinorrhea.     Right Sinus: No maxillary sinus tenderness or frontal sinus tenderness.     Left Sinus: No maxillary sinus tenderness or frontal sinus tenderness.     Mouth/Throat:     Pharynx: Uvula midline.  Eyes:     General: Lids are normal. Lids are everted, no foreign bodies appreciated.     Conjunctiva/sclera: Conjunctivae normal.     Pupils: Pupils are equal, round, and reactive to light.  Neck:     Thyroid: No thyroid mass or thyromegaly.     Vascular: No carotid bruit.     Trachea: Trachea normal.  Cardiovascular:     Rate and Rhythm: Normal rate and regular rhythm.     Pulses: Normal pulses.     Heart sounds: Normal heart sounds, S1 normal and S2 normal. No murmur heard.    No friction rub. No gallop.  Pulmonary:     Effort: Pulmonary effort is normal. No tachypnea or respiratory distress.     Breath sounds: Normal breath sounds. No decreased breath sounds, wheezing, rhonchi or rales.  Abdominal:     General: Bowel sounds are normal.     Palpations: Abdomen is soft.     Tenderness: There is no abdominal tenderness.  Musculoskeletal:     Cervical back: Normal range of motion and neck supple.  Lymphadenopathy:     Head:     Right side of head: Tonsillar, preauricular, posterior auricular and occipital adenopathy present.     Cervical: Cervical adenopathy present.     Right cervical: Superficial cervical adenopathy present.  Skin:    General: Skin is warm and dry.     Findings: No rash.  Neurological:     Mental Status: She is alert.  Psychiatric:        Mood and Affect: Mood is not anxious or depressed.        Speech: Speech normal.        Behavior:  Behavior normal. Behavior is cooperative.        Thought Content: Thought content normal.        Judgment: Judgment normal.       Results for orders placed or performed  in visit on 2023-06-02  CBC with Differential/Platelet   Collection Time: 02-Jun-2023  1:21 PM  Result Value Ref Range   WBC 4.5 4.0 - 10.5 K/uL   RBC 4.84 3.87 - 5.11 Mil/uL   Hemoglobin 12.7 12.0 - 15.0 g/dL   HCT 16.1 09.6 - 04.5 %   MCV 80.6 78.0 - 100.0 fl   MCHC 32.5 30.0 - 36.0 g/dL   RDW 40.9 (H) 81.1 - 91.4 %   Platelets 312.0 150.0 - 400.0 K/uL   Neutrophils Relative % 59.4 43.0 - 77.0 %   Lymphocytes Relative 29.3 12.0 - 46.0 %   Monocytes Relative 7.4 3.0 - 12.0 %   Eosinophils Relative 3.1 0.0 - 5.0 %   Basophils Relative 0.8 0.0 - 3.0 %   Neutro Abs 2.7 1.4 - 7.7 K/uL   Lymphs Abs 1.3 0.7 - 4.0 K/uL   Monocytes Absolute 0.3 0.1 - 1.0 K/uL   Eosinophils Absolute 0.1 0.0 - 0.7 K/uL   Basophils Absolute 0.0 0.0 - 0.1 K/uL  Comprehensive metabolic panel   Collection Time: June 02, 2023  1:21 PM  Result Value Ref Range   Sodium 139 135 - 145 mEq/L   Potassium 4.1 3.5 - 5.1 mEq/L   Chloride 105 96 - 112 mEq/L   CO2 24 19 - 32 mEq/L   Glucose, Bld 77 70 - 99 mg/dL   BUN 16 6 - 23 mg/dL   Creatinine, Ser 7.82 0.40 - 1.20 mg/dL   Total Bilirubin 0.3 0.2 - 1.2 mg/dL   Alkaline Phosphatase 59 39 - 117 U/L   AST 20 0 - 37 U/L   ALT 26 0 - 35 U/L   Total Protein 7.1 6.0 - 8.3 g/dL   Albumin 4.3 3.5 - 5.2 g/dL   GFR 956.21 >30.86 mL/min   Calcium 9.3 8.4 - 10.5 mg/dL  B. burgdorfi antibodies by WB   Collection Time: 06/02/2023  1:21 PM  Result Value Ref Range   B burgdorferi IgG Abs (IB) NEGATIVE NEGATIVE   Lyme Disease 18 kD IgG NON-REACTIVE    Lyme Disease 23 kD IgG NON-REACTIVE    Lyme Disease 28 kD IgG NON-REACTIVE    Lyme Disease 30 kD IgG NON-REACTIVE    Lyme Disease 39 kD IgG NON-REACTIVE    Lyme Disease 41 kD IgG REACTIVE (A)    Lyme Disease 45 kD IgG NON-REACTIVE    Lyme Disease 58 kD IgG  NON-REACTIVE    Lyme Disease 66 kD IgG NON-REACTIVE    Lyme Disease 93 kD IgG NON-REACTIVE    B burgdorferi IgM Abs (IB) NEGATIVE NEGATIVE   Lyme Disease 23 kD IgM NON-REACTIVE    Lyme Disease 39 kD IgM NON-REACTIVE    Lyme Disease 41 kD IgM NON-REACTIVE   Ehrlichia antibody panel   Collection Time: 06/02/23  1:21 PM  Result Value Ref Range   E. CHAFFEENSIS AB IGG <1:64    E. CHAFFEENSIS AB IGM <1:20    INTERPRETATION    Rocky mtn spotted fvr abs pnl(IgG+IgM)   Collection Time: 02-Jun-2023  1:21 PM  Result Value Ref Range   RMSF IgG Not Detected Not Detected   RMSF IgM Not Detected Not Detected  Mumps antibody, IgM   Collection Time: 06-02-2023  1:21 PM  Result Value Ref Range   Mumps IgM Value <1:20 titer  CMV IgM   Collection Time: 06/02/2023  1:21 PM  Result Value Ref Range   CMV IgM <30.00 AU/mL    Assessment and Plan  Lymphadenitis -     CBC with Differential/Platelet -     Comprehensive metabolic panel with GFR -     B. burgdorfi antibodies by WB -     Ehrlichia antibody panel -     Rocky mtn spotted fvr abs pnl(IgG+IgM) -     Mumps antibody, IgM -     CMV IgM  Arthralgia, unspecified joint  Neck pain, acute  Unclear etiology, she has started Augmentin with minimal improvement so far, but she has only had 3 doses.  Lymphadenopathy seems to be increasing.  Will evaluate with complete metabolic panel, CBC as well as testing for viral infection as etiology. Recommend continued ibuprofen 800 mg 3 times daily as needed for pain. Given possible exposure to tick in past will evaluate with Lyme disease testing. If not improving as expected she will return for further evaluation. Her PCP has left the office so if needed she will follow-up with me until she has set up a new PCP at our office. No follow-ups on file.   Kerby Nora, MD

## 2023-05-12 ENCOUNTER — Encounter: Payer: Self-pay | Admitting: Family Medicine

## 2023-05-12 LAB — CBC WITH DIFFERENTIAL/PLATELET
Basophils Absolute: 0 10*3/uL (ref 0.0–0.1)
Basophils Relative: 0.8 % (ref 0.0–3.0)
Eosinophils Absolute: 0.1 10*3/uL (ref 0.0–0.7)
Eosinophils Relative: 3.1 % (ref 0.0–5.0)
HCT: 39 % (ref 36.0–46.0)
Hemoglobin: 12.7 g/dL (ref 12.0–15.0)
Lymphocytes Relative: 29.3 % (ref 12.0–46.0)
Lymphs Abs: 1.3 10*3/uL (ref 0.7–4.0)
MCHC: 32.5 g/dL (ref 30.0–36.0)
MCV: 80.6 fl (ref 78.0–100.0)
Monocytes Absolute: 0.3 10*3/uL (ref 0.1–1.0)
Monocytes Relative: 7.4 % (ref 3.0–12.0)
Neutro Abs: 2.7 10*3/uL (ref 1.4–7.7)
Neutrophils Relative %: 59.4 % (ref 43.0–77.0)
Platelets: 312 10*3/uL (ref 150.0–400.0)
RBC: 4.84 Mil/uL (ref 3.87–5.11)
RDW: 16.2 % — ABNORMAL HIGH (ref 11.5–15.5)
WBC: 4.5 10*3/uL (ref 4.0–10.5)

## 2023-05-12 LAB — COMPREHENSIVE METABOLIC PANEL
ALT: 26 U/L (ref 0–35)
AST: 20 U/L (ref 0–37)
Albumin: 4.3 g/dL (ref 3.5–5.2)
Alkaline Phosphatase: 59 U/L (ref 39–117)
BUN: 16 mg/dL (ref 6–23)
CO2: 24 meq/L (ref 19–32)
Calcium: 9.3 mg/dL (ref 8.4–10.5)
Chloride: 105 meq/L (ref 96–112)
Creatinine, Ser: 0.63 mg/dL (ref 0.40–1.20)
GFR: 125.5 mL/min (ref >60.00)
Glucose, Bld: 77 mg/dL (ref 70–99)
Potassium: 4.1 meq/L (ref 3.5–5.1)
Sodium: 139 meq/L (ref 135–145)
Total Bilirubin: 0.3 mg/dL (ref 0.2–1.2)
Total Protein: 7.1 g/dL (ref 6.0–8.3)

## 2023-05-14 LAB — B. BURGDORFI ANTIBODIES BY WB

## 2023-05-14 LAB — ROCKY MTN SPOTTED FVR ABS PNL(IGG+IGM)
RMSF IgG: NOT DETECTED
RMSF IgM: NOT DETECTED

## 2023-05-14 LAB — EHRLICHIA ANTIBODY PANEL
E. CHAFFEENSIS AB IGG: <1:64 {titer}
E. CHAFFEENSIS AB IGM: <1:20 {titer}

## 2023-05-14 LAB — MUMPS ANTIBODY, IGM: Mumps IgM Value: <1:20 {titer}

## 2023-05-14 LAB — CMV IGM: CMV IgM: <30 [AU]/ml

## 2023-05-15 ENCOUNTER — Other Ambulatory Visit: Payer: Self-pay

## 2023-05-15 ENCOUNTER — Emergency Department
Admission: EM | Admit: 2023-05-15 | Discharge: 2023-05-15 | Disposition: A | Discharge status: Home / Self Care | Attending: Emergency Medicine | Admitting: Emergency Medicine

## 2023-05-15 DIAGNOSIS — M545 Low back pain, unspecified: Secondary | ICD-10-CM | POA: Diagnosis not present

## 2023-05-15 DIAGNOSIS — M25511 Pain in right shoulder: Secondary | ICD-10-CM | POA: Diagnosis not present

## 2023-05-15 DIAGNOSIS — M25512 Pain in left shoulder: Secondary | ICD-10-CM | POA: Insufficient documentation

## 2023-05-15 DIAGNOSIS — R590 Localized enlarged lymph nodes: Secondary | ICD-10-CM | POA: Diagnosis not present

## 2023-05-15 DIAGNOSIS — R591 Generalized enlarged lymph nodes: Secondary | ICD-10-CM | POA: Diagnosis not present

## 2023-05-15 DIAGNOSIS — M791 Myalgia, unspecified site: Secondary | ICD-10-CM | POA: Diagnosis present

## 2023-05-15 LAB — CBC WITH DIFFERENTIAL/PLATELET
Abs Immature Granulocytes: 0.02 10*3/uL (ref 0.00–0.07)
Basophils Absolute: 0.1 10*3/uL (ref 0.0–0.1)
Basophils Relative: 2 %
Eosinophils Absolute: 0.1 10*3/uL (ref 0.0–0.5)
Eosinophils Relative: 2 %
HCT: 37.5 % (ref 36.0–46.0)
Hemoglobin: 12.2 g/dL (ref 12.0–15.0)
Immature Granulocytes: 0 %
Lymphocytes Relative: 30 %
Lymphs Abs: 1.9 10*3/uL (ref 0.7–4.0)
MCH: 26.3 pg (ref 26.0–34.0)
MCHC: 32.5 g/dL (ref 30.0–36.0)
MCV: 81 fL (ref 80.0–100.0)
Monocytes Absolute: 0.5 10*3/uL (ref 0.1–1.0)
Monocytes Relative: 7 %
Neutro Abs: 3.7 10*3/uL (ref 1.7–7.7)
Neutrophils Relative %: 59 %
Platelets: 344 10*3/uL (ref 150–400)
RBC: 4.63 MIL/uL (ref 3.87–5.11)
RDW: 14.9 % (ref 11.5–15.5)
WBC: 6.4 10*3/uL (ref 4.0–10.5)
nRBC: 0 % (ref 0.0–0.2)

## 2023-05-15 LAB — COMPREHENSIVE METABOLIC PANEL
ALT: 28 U/L (ref 0–44)
AST: 20 U/L (ref 15–41)
Albumin: 4.1 g/dL (ref 3.5–5.0)
Alkaline Phosphatase: 52 U/L (ref 38–126)
Anion gap: 6 (ref 5–15)
BUN: 13 mg/dL (ref 6–20)
CO2: 22 mmol/L (ref 22–32)
Calcium: 8.9 mg/dL (ref 8.9–10.3)
Chloride: 111 mmol/L (ref 98–111)
Creatinine, Ser: 0.62 mg/dL (ref 0.44–1.00)
GFR, Estimated: >60 mL/min (ref >60)
Glucose, Bld: 87 mg/dL (ref 70–99)
Potassium: 3.7 mmol/L (ref 3.5–5.1)
Sodium: 139 mmol/L (ref 135–145)
Total Bilirubin: 0.6 mg/dL (ref 0.0–1.2)
Total Protein: 7.5 g/dL (ref 6.5–8.1)

## 2023-05-15 LAB — RESP PANEL BY RT-PCR (RSV, FLU A&B, COVID)  RVPGX2
Influenza A by PCR: NEGATIVE
Influenza B by PCR: NEGATIVE
Resp Syncytial Virus by PCR: NEGATIVE
SARS Coronavirus 2 by RT PCR: NEGATIVE

## 2023-05-15 LAB — SEDIMENTATION RATE: Sed Rate: 14 mm/h (ref 0–20)

## 2023-05-15 LAB — LACTATE DEHYDROGENASE: LDH: 216 U/L — ABNORMAL HIGH (ref 98–192)

## 2023-05-15 MED ORDER — TRAZODONE HCL 50 MG PO TABS: 50.0000 mg | ORAL_TABLET | Freq: Every day | ORAL | 1 refills | Status: DC

## 2023-05-15 NOTE — ED Triage Notes (Signed)
 Pt sts that she has generalized body pain that has been going on for the last week. Pt sts that she was put on abx by her PCP and nothing is helping.

## 2023-05-15 NOTE — ED Provider Notes (Signed)
 [facility] Provider Note    Event Date/Time   First MD Initiated Contact with Patient 05/15/23 1144     (approximate)   History   Generalized Body Aches   HPI  Kayla Nguyen is a 23 y.o. female with no PMH who presents for evaluation of generalized bodyaches and lymphedema.  Patient states that her symptoms began about 2 weeks ago.  She endorses multiple lymph nodes being swollen in her neck, bilateral shoulder pain, lower back pain and hand swelling.  Patient has been evaluated by her PCP who started her on Augmentin.  She has 1 day left of this medication.  Previous workup included CBC, CMP, mononucleosis screen, ehrlichiosis testing and Lyme disease testing.  Workup was negative.  Patient has not had any improvement with time or antibiotic which is what is bringing her into the emergency department today.  Patient states that her father passed away in the last month which has caused significant distress and difficulty sleeping.  She has been feeling very fatigued.  She denies fever, night sweats and unexplained weight loss.     Physical Exam   Triage Vital Signs: ED Triage Vitals [05/15/23 1117]  Encounter Vitals Group     BP (!) 146/97     Systolic BP Percentile      Diastolic BP Percentile      Pulse Rate 84     Resp 17     Temp 98.6 F (37 C)     Temp Source Oral     SpO2 100 %     Weight 212 lb 8.4 oz (96.4 kg)     Height 5\' 5"  (1.651 m)     Head Circumference      Peak Flow      Pain Score 7     Pain Loc      Pain Education      Exclude from Growth Chart     Most recent vital signs: Vitals:   05/15/23 1117  BP: (!) 146/97  Pulse: 84  Resp: 17  Temp: 98.6 F (37 C)  SpO2: 100%   General: Awake, no distress.  CV:  Good peripheral perfusion.  RRR. Resp:  Normal effort.  CTAB. Abd:  No distention.  Other:  Tender palpable right postauricular, left and right posterior cervical lymph node   ED Results / Procedures / Treatments    Labs (all labs ordered are listed, but only abnormal results are displayed) Labs Reviewed  LACTATE DEHYDROGENASE - Abnormal; Notable for the following components:      Result Value   LDH 216 (*)    All other components within normal limits  RESP PANEL BY RT-PCR (RSV, FLU A&B, COVID)  RVPGX2  COMPREHENSIVE METABOLIC PANEL  CBC WITH DIFFERENTIAL/PLATELET  SEDIMENTATION RATE  THYROID PANEL WITH TSH    PROCEDURES:  Critical Care performed: No  Procedures   MEDICATIONS ORDERED IN ED: Medications - No data to display   IMPRESSION / MDM / ASSESSMENT AND PLAN / ED COURSE  I reviewed the triage vital signs and the nursing notes.                             23 year old female presents for evaluation of generalized bodyaches and lymphadenopathy.  Blood pressure is elevated otherwise vital signs are stable patient NAD on exam.  Differential diagnosis includes, but is not limited to, auto-immune disease, cat scratch fever, lymphoma, mononucleosis, viral infection.  Patient's presentation is most consistent with acute complicated illness / injury requiring diagnostic workup.  CBC and BMP are unremarkable.  ESR is WNL.  Lactate dehydrogenase is elevated.  Thyroid panel is pending.  Respiratory panel is negative.  I reviewed patient's results with the patient and her mother.  I explained that the LDH being elevated is nonspecific.  Workup is reassuring and I do not feel she needs further emergent testing at this time.  Most suspicious that this is stress related versus autoimmune.  Given that ESR is not elevated I do not feel steroids are needed at this time.  Recommended that patient continue to take over-the-counter medications as needed.  She was given follow-up information for rheumatology.  Did consider possibility of lymphoma given the enlarged lymph nodes.  However with blood work being reassuring and lack of B symptoms, I do not feel she needs a referral for oncology at this point  in time.  Advised patient to return to the ER with worsening symptoms.  Will send medication to help with her insomnia.  Patient voiced understanding, all questions were answered and she was stable at discharge.       FINAL CLINICAL IMPRESSION(S) / ED DIAGNOSES   Final diagnoses:  Lymphadenopathy of head and neck     Rx / DC Orders   ED Discharge Orders          Ordered    traZODone (DESYREL) 50 MG tablet  Daily at bedtime        05/15/23 1454             Note:  This document was prepared using Dragon voice recognition software and may include unintentional dictation errors.   Cameron Ali, PA-C 05/15/23 1503    Minna Antis, MD 05/15/23 [phone]

## 2023-05-15 NOTE — ED Provider Notes (Signed)
-----------------------------------------   1:34 PM on 05/15/2023 ----------------------------------------- I have personally seen and evaluated the patient in conjunction with Cruz Condon, PA.  Patient symptoms are rather vague but she is complaining of a sensation of swelling in her hands and her feet feels like her lymph nodes in her neck are swollen, states she has been feeling very fatigued has also noticed an intermittent rash over both of her cheeks.  Denies any known fever.  No upper respiratory symptoms like cough congestion or sore throat.  She has seen her PCP who did a workup that showed no significant finding.  Given the patient's description swelling we will check labs including ESR and a TSH.  Given the rash over both cheeks and she states extended over the nasal bridge there is concern for possible early lupus although there is no rash currently.  In speaking to the patient and mother in more depth the patient's father just passed away 1 month ago and she has been under a significant amount of stress.  Patient states she has not been sleeping much at all.  This could be contributing to the patient's symptoms as well.  We will check labs if the patient's ESR is elevated I believe a trial of prednisone and rheumatology referral would be warranted, if not elevated then I believe a sleep aid may be beneficial for the patient to help with some of her sleep issues which could be contributing to her chronic fatigue and symptoms as well.  My evaluation there is possibly a minimal amount of anterior cervical lymphadenopathy.  No axilla lymphadenopathy.  No sore throat no pharyngeal erythema.  Patient states she feels like her hands and feet are somewhat swollen no obvious swelling on examination although somewhat limited by habitus.   Minna Antis, MD 05/16/23 1800

## 2023-05-15 NOTE — ED Notes (Signed)
 See triage note  Presents with some swelling to hands  Also having pain to lower back which moves into both legs and into her neck  States she has been seen by PCP  Placed on amoxil Cont's to feel worse

## 2023-05-15 NOTE — Discharge Instructions (Signed)
 Please schedule a follow up appointment with rheumatology. Information is attached.   You can take the trazadone before bedtime to help you sleep. Do not drive after taking it or drink alcohol while taking this medication.   Return to the ED with any worsening symptoms.

## 2023-05-16 LAB — THYROID PANEL WITH TSH
Free Thyroxine Index: 2.2 (ref 1.2–4.9)
T3 Uptake Ratio: 29 % (ref 24–39)
T4, Total: 7.6 ug/dL (ref 4.5–12.0)
TSH: 1.84 u[IU]/mL (ref 0.450–4.500)

## 2023-05-17 ENCOUNTER — Encounter: Payer: Self-pay | Admitting: Medical Oncology

## 2023-05-17 ENCOUNTER — Ambulatory Visit: Admitting: Family Medicine

## 2023-05-17 ENCOUNTER — Encounter: Payer: Self-pay | Admitting: Family Medicine

## 2023-05-17 VITALS — BP 104/78 | HR 77 | Temp 98.5°F | Ht 65.0 in | Wt 212.0 lb

## 2023-05-17 DIAGNOSIS — R21 Rash and other nonspecific skin eruption: Secondary | ICD-10-CM

## 2023-05-17 DIAGNOSIS — N939 Abnormal uterine and vaginal bleeding, unspecified: Secondary | ICD-10-CM

## 2023-05-17 DIAGNOSIS — M25532 Pain in left wrist: Secondary | ICD-10-CM | POA: Diagnosis not present

## 2023-05-17 DIAGNOSIS — R1030 Lower abdominal pain, unspecified: Secondary | ICD-10-CM

## 2023-05-17 DIAGNOSIS — M25531 Pain in right wrist: Secondary | ICD-10-CM | POA: Diagnosis not present

## 2023-05-17 DIAGNOSIS — R5383 Other fatigue: Secondary | ICD-10-CM | POA: Diagnosis not present

## 2023-05-17 DIAGNOSIS — R591 Generalized enlarged lymph nodes: Secondary | ICD-10-CM | POA: Diagnosis not present

## 2023-05-17 LAB — POC URINALSYSI DIPSTICK (AUTOMATED)
Bilirubin, UA: NEGATIVE
Glucose, UA: NEGATIVE
Ketones, UA: NEGATIVE
Leukocytes, UA: NEGATIVE
Nitrite, UA: NEGATIVE
Protein, UA: NEGATIVE
Spec Grav, UA: 1.02 (ref 1.010–1.025)
Urobilinogen, UA: 0.2 U/dL
pH, UA: 6 (ref 5.0–8.0)

## 2023-05-17 LAB — C-REACTIVE PROTEIN: CRP: <1 mg/dL (ref 0.5–20.0)

## 2023-05-17 NOTE — Progress Notes (Signed)
 Patient ID: Kayla Nguyen, female    DOB: 2000/05/14, 23 y.o.   MRN: [id number]  This visit was conducted in person.  BP 104/78 (BP Location: Left Arm, Patient Position: Sitting, Cuff Size: Large)   Pulse 77   Temp 98.5 F (36.9 C) (Temporal)   Ht 5\' 5"  (1.651 m)   Wt 212 lb (96.2 kg)   SpO2 99%   BMI 35.28 kg/m    CC:  Chief Complaint  Patient presents with   Follow-up    Lymphadenopathy 05/11/23    Subjective:   HPI: Kayla Nguyen is a 23 y.o. female patient of Lequita Halt is drums, PA presenting on 05/17/2023 for Follow-up (Lymphadenopathy 05/11/23)  I recently saw the patient on March 19 for lymphadenitis.  She was currently being treated following urgent care visit with Augmentin.  Lab evaluation has so far been negative for mono, CMV, mumps, tick borne illness.  CBC was in nml range.  She went to the ED on May 15, 2023 given worsening generalized body aches and lymphadenopathy... No improvement with Augmentin Symptoms have been going on 2 weeks at that point. Progressively swollen lymph nodes in neck and head, bilateral shoulder pain low back pain and hand swelling. Wrists both wrists sore, occ albows. Bottoms of feet sore. Also noticed facial redness of cheeks.. now spread to left cheek and bridge of nose. Fatigue continued.  CBC and basic metabolic panel were unremarkable.  Sed rate was within normal limit.  COVID, flu and RSV negative TSH, free T3 and T4 normal Felt that autoimmune issue versus stress most likely cause at this point. Given trazodone to use at bedtime Referral placed for rheumatology to consider lupus  No urinary issues.  Having ache bottom side lower abdomen more consistent now... vaginal bleeding  spotting light.Marland Kitchen x 3 days.  New lymph node in left lower neck    No lymph nodes and groin.  Feeling some SOB and heart pounding when getting ready to go to bed.   Typically very heavy menses... on depo for last 4 months.    Ibuprofen 800 mg  BID  unhelpful.  Relevant past medical, surgical, family and social history reviewed and updated as indicated. Interim medical history since our last visit reviewed. Allergies and medications reviewed and updated. Outpatient Medications Prior to Visit  Medication Sig Dispense Refill   medroxyPROGESTERone (DEPO-PROVERA) 150 MG/ML injection Inject 1 mL (150 mg total) into the muscle every 3 (three) months. 1 mL 3   traZODone (DESYREL) 50 MG tablet Take 1 tablet (50 mg total) by mouth at bedtime. (Patient not taking: Reported on 05/26/2023) 30 tablet 1   gabapentin (NEURONTIN) 300 MG capsule Take 300 mg by mouth once.     methocarbamol (ROBAXIN) 500 MG tablet Take 500 mg by mouth 4 (four) times daily.     Facility-Administered Medications Prior to Visit  Medication Dose Route Frequency Provider Last Rate Last Admin   medroxyPROGESTERone (DEPO-PROVERA) injection 150 mg  150 mg Intramuscular Q90 days Saralyn Pilar A, PA   150 mg at 01/06/23 1349     Per HPI unless specifically indicated in ROS section below Review of Systems  Constitutional:  Positive for fatigue. Negative for fever.  HENT:  Negative for congestion.   Eyes:  Negative for pain.  Respiratory:  Negative for cough and shortness of breath.   Cardiovascular:  Negative for chest pain, palpitations and leg swelling.  Gastrointestinal:  Negative for abdominal pain.  Genitourinary:  Negative for dysuria and vaginal  bleeding.  Musculoskeletal:  Positive for joint swelling. Negative for back pain.  Neurological:  Negative for syncope, light-headedness and headaches.  Psychiatric/Behavioral:  Negative for dysphoric mood.    Objective:  BP 104/78 (BP Location: Left Arm, Patient Position: Sitting, Cuff Size: Large)   Pulse 77   Temp 98.5 F (36.9 C) (Temporal)   Ht 5\' 5"  (1.651 m)   Wt 212 lb (96.2 kg)   SpO2 99%   BMI 35.28 kg/m   Wt Readings from Last 3 Encounters:  05/26/23 209 lb 14.4 oz (95.2 kg)  05/17/23 212 lb (96.2 kg)   05/15/23 212 lb 8.4 oz (96.4 kg)      Physical Exam Constitutional:      General: She is not in acute distress.    Appearance: Normal appearance. She is well-developed. She is not ill-appearing or toxic-appearing.  HENT:     Head: Normocephalic.     Right Ear: Hearing, tympanic membrane, ear canal and external ear normal. Tympanic membrane is not erythematous, retracted or bulging.     Left Ear: Hearing, tympanic membrane, ear canal and external ear normal. Tympanic membrane is not erythematous, retracted or bulging.     Nose: No mucosal edema or rhinorrhea.     Right Sinus: No maxillary sinus tenderness or frontal sinus tenderness.     Left Sinus: No maxillary sinus tenderness or frontal sinus tenderness.     Mouth/Throat:     Pharynx: Uvula midline.  Eyes:     General: Lids are normal. Lids are everted, no foreign bodies appreciated.     Conjunctiva/sclera: Conjunctivae normal.     Pupils: Pupils are equal, round, and reactive to light.  Neck:     Thyroid: No thyroid mass or thyromegaly.     Vascular: No carotid bruit.     Trachea: Trachea normal.  Cardiovascular:     Rate and Rhythm: Normal rate and regular rhythm.     Pulses: Normal pulses.     Heart sounds: Normal heart sounds, S1 normal and S2 normal. No murmur heard.    No friction rub. No gallop.  Pulmonary:     Effort: Pulmonary effort is normal. No tachypnea or respiratory distress.     Breath sounds: Normal breath sounds. No decreased breath sounds, wheezing, rhonchi or rales.  Abdominal:     General: Bowel sounds are normal.     Palpations: Abdomen is soft.     Tenderness: There is no abdominal tenderness.  Musculoskeletal:     Cervical back: Normal range of motion and neck supple.  Lymphadenopathy:     Head:     Right side of head: Tonsillar, preauricular, posterior auricular and occipital adenopathy present.     Cervical: Cervical adenopathy present.     Right cervical: Superficial cervical adenopathy and  posterior cervical adenopathy present.     Upper Body:     Right upper body: No supraclavicular, axillary, pectoral or epitrochlear adenopathy.     Lower Body: No right inguinal adenopathy.  Skin:    General: Skin is warm and dry.     Findings: No rash.  Neurological:     Mental Status: She is alert.  Psychiatric:        Mood and Affect: Mood is not anxious or depressed.        Speech: Speech normal.        Behavior: Behavior normal. Behavior is cooperative.        Thought Content: Thought content normal.  Judgment: Judgment normal.       Results for orders placed or performed in visit on 05/17/23  POCT Urinalysis Dipstick (Automated)   Collection Time: 05/17/23 11:48 AM  Result Value Ref Range   Color, UA Yellow    Clarity, UA Clear    Glucose, UA Negative Negative   Bilirubin, UA Negative    Ketones, UA Negative    Spec Grav, UA 1.020 1.010 - 1.025   Blood, UA Large (3+)    pH, UA 6.0 5.0 - 8.0   Protein, UA Negative Negative   Urobilinogen, UA 0.2 0.2 or 1.0 E.U./dL   Nitrite, UA Negative    Leukocytes, UA Negative Negative  C-reactive protein   Collection Time: 05/17/23 11:54 AM  Result Value Ref Range   CRP <1.0 0.5 - 20.0 mg/dL  ANA w/Reflex   Collection Time: 05/17/23 11:54 AM  Result Value Ref Range   Anti Nuclear Antibody (ANA) Negative Negative  Lupus anticoagulant panel   Collection Time: 05/17/23 11:54 AM  Result Value Ref Range   PTT Lupus Anticoagulant 42.4 0.0 - 43.5 sec   Dilute Viper Venom Time 36.4 0.0 - 47.0 sec   Lupus Anticoag Interp Comment:     Assessment and Plan  Lymphadenopathy of head and neck Assessment & Plan: Acute, no improvement with antibiotics. Negative viral testing. Concern for possible lupus will do testing with C-reactive protein and ANA. Will move forward with referral to rheumatology. Refer to oncology for further assessment, especially if negative lupus testing.  Orders: -     Ambulatory referral to Hematology  / Oncology -     Ambulatory referral to Rheumatology -     C-reactive protein -     ANA w/Reflex  Lower abdominal pain -     POCT Urinalysis Dipstick (Automated)  Other fatigue -     Ambulatory referral to Rheumatology  Pain of both wrist joints -     Ambulatory referral to Rheumatology -     C-reactive protein -     ANA w/Reflex  Vaginal bleeding Assessment & Plan: Chronic, given reports of heavy vaginal bleeding at times will evaluate with lupus anticoagulant.  Orders: -     Lupus anticoagulant panel  Butterfly rash Assessment & Plan: Acute, suggestive of lupus     No follow-ups on file.   Kerby Nora, MD

## 2023-05-19 LAB — LUPUS ANTICOAGULANT PANEL
Dilute Viper Venom Time: 36.4 s (ref 0.0–47.0)
PTT Lupus Anticoagulant: 42.4 s (ref 0.0–43.5)

## 2023-05-19 LAB — ANA W/REFLEX: Anti Nuclear Antibody (ANA): NEGATIVE

## 2023-05-20 ENCOUNTER — Encounter: Payer: Self-pay | Admitting: *Deleted

## 2023-05-20 ENCOUNTER — Encounter: Payer: Self-pay | Admitting: Family Medicine

## 2023-05-23 ENCOUNTER — Inpatient Hospital Stay: Admitting: General Practice

## 2023-05-25 NOTE — Progress Notes (Signed)
 HEMATOLOGY/ONCOLOGY CONSULTATION NOTE  Date of Service: 05/26/2023  Patient Care Team: Melida Quitter, PA as PCP - General (Family Medicine)  CHIEF COMPLAINTS/PURPOSE OF CONSULTATION:  lymphadenopathy  HISTORY OF PRESENTING ILLNESS:  Kayla Nguyen is a wonderful 23 y.o. female who has been referred to [place] by Melida Quitter, PA for evaluation and management of lymphadenopathy.   Today, she is accompanied by her mother. Patient complains of relatively new enlarged neck lymph nodes, which have been swollen for over a month. She reports having on enlarged lymph node in the back of the neck at base of her hair. Patient reports having one enlarged lymph node behind her ear which had resolved. She has a lymph node on her left neck which had been present for 2 weeks.   She does feel enlarged lymph nodes at this time, though they have significantly decreased in size. She notes that one of her right neck lymph nodes was previously quarter-sized and is currently dime-sized. She reports that she does have an enlarged lymph node on the left neck which is bigger in size. Patient denies any concern for scalp itchiness or dandruff.  She denies having any other medical issues in the past. Patient denies any previous reason for enlarged lymph nodes. She denies having any surgeries in the past. Patient has no food allergies or known medication allergies.   Patient is not a smoker and denies any alcohol use outside of social use. She denies any use of recreational chemicals.   She reports that her symptoms began with a mild headache, which presented over 1 month ago, after her father passed away. Patient attributed her headache to stress. Her headache was present in the lower back of her head and was persistent. She presented to urgent care and received blood tests which ruled out lime disease among other factors. Patient was sent to her PCP for additional labs which showed normal findings. Patient did not  receive a scan of the neck or chest.  Patient complains of itchiness mainly in her legs. Patient denies any leg swelling.   She reports hand swelling and redness. Patient also reports itchiness in her palms spreading to her arms.   She complains of severe body pain sometimes, mainly localized in the muscles and joints simultaneouly. Patient reports deep pain in the shoulder, knees, and arms. She reports painful wrist joints with no redness in the area.   She also reports redness across her nose and cheeks.   Patient reports that her body pain and overall swelling in the hands are the most bothersome at this time.   She was not on steroids for any time around these symptoms.   She reports that she has been recently declined rheumatolgy referral.   She complains of lower abnominal pain. She also reports abnormal vaginal spotting bleeding since 2-3 weeks with blood clots. Patient has been on depo shot for 6 months and hasn't had a period since December. Patient gets a Depo shot every 3 months.Patient denies any recent injuries. Her bleeding has not been evaluated by OB/GYN.   Patient denies any change in bowel habits, discomfort passing irine, or other infections recently.   Patient does not use any OTC supplements or new cosmetics/soap products. She reports that she does not tend to have seasonal allergies. 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 She reports no recent travel outside of the [place] in the last 6 months. Patient works with [employer].  Patient reports that her father had a hx of RA with mildly high Rh factor. It was noted that her father could not use his hands much though symptoms were not significant. She denies any other fhx of autoimmune condition.   Patient was noted to be wearing a medical boot on her right leg. She reports having a fall on a ramp while making a delivery.   Patient reports that she has previously connected with a counselor to  discuss previous issues, which has been beneficial. .  She reports back pain in a local area on palpation as well as lower back pain.   Patient complains of pain across the lower abdomen sometimes, which is also painful with pressure.   She reports pain on palpation to the left axillary. She denies any breast pain or lumps in the breast.   She complains of nausea sometimes in the mornings and evenings present for a few weeks. She denies any vomiting. Patient denies any possibility that she may be pregnant. Patient denies any fever, unexpected sudden weight loss, sore throat, rhinnorhea, or cough.   MEDICAL HISTORY:  No past medical history on file.  SURGICAL HISTORY: No past surgical history on file.  SOCIAL HISTORY: Social History   Socioeconomic History   Marital status: Single    Spouse name: Not on file   Number of children: Not on file   Years of education: Not on file   Highest education level: Not on file  Occupational History   Not on file  Tobacco Use   Smoking status: Never    Passive exposure: Never   Smokeless tobacco: Never  Vaping Use   Vaping status: Never Used  Substance and Sexual Activity   Alcohol use: No   Drug use: No   Sexual activity: Yes    Birth control/protection: Injection  Other Topics Concern   Not on file  Social History Narrative   Not on file   Social Drivers of Health   Financial Resource Strain: Not on file  Food Insecurity: Not on file  Transportation Needs: Not on file  Physical Activity: Not on file  Stress: Not on file  Social Connections: Not on file  Intimate Partner Violence: Not on file    FAMILY HISTORY: Family History  Problem Relation Age of Onset   Polycythemia Mother    Diabetes Father    Prostate cancer Father    Bladder Cancer Father    Diabetes Sister    Diabetes Paternal Grandfather     ALLERGIES:  has no known allergies.  MEDICATIONS:  Current Outpatient Medications  Medication Sig Dispense Refill    medroxyPROGESTERone (DEPO-PROVERA) 150 MG/ML injection Inject 1 mL (150 mg total) into the muscle every 3 (three) months. 1 mL 3   traZODone (DESYREL) 50 MG tablet Take 1 tablet (50 mg total) by mouth at bedtime. 30 tablet 1   Current Facility-Administered Medications  Medication Dose Route Frequency Provider Last Rate Last Admin   medroxyPROGESTERone (DEPO-PROVERA) injection 150 mg  150 mg Intramuscular Q90 days Saralyn Pilar A, PA   150 mg at 01/06/23 1349    REVIEW OF SYSTEMS:    10 Point review of Systems was done is negative except as noted above.  PHYSICAL EXAMINATION: ECOG PERFORMANCE STATUS: {CHL ONC ECOG ZO:[id number]}  .There were no vitals filed for this visit. There were no vitals filed for this visit. .There is no height or weight on file to calculate BMI.  GENERAL:alert, in no acute distress and comfortable  SKIN: no acute rashes, no significant lesions EYES: conjunctiva are pink and non-injected, sclera anicteric OROPHARYNX: MMM, no exudates, no oropharyngeal erythema or ulceration NECK: supple, no JVD LYMPH:  no palpable lymphadenopathy in the cervical, axillary or inguinal regions LUNGS: clear to auscultation b/l with normal respiratory effort HEART: regular rate & rhythm ABDOMEN:  normoactive bowel sounds , non tender, not distended. Extremity: no pedal edema PSYCH: alert & oriented x 3 with fluent speech NEURO: no focal motor/sensory deficits  LABORATORY DATA:  I have reviewed the data as listed  .    Latest Ref Rng & Units 05/15/2023    1:11 PM 05/11/2023    1:21 PM 02/22/2023    9:04 AM  CBC  WBC 4.0 - 10.5 K/uL 6.4  4.5  8.7   Hemoglobin 12.0 - 15.0 g/dL 04.5  40.9  81.1   Hematocrit 36.0 - 46.0 % 37.5  39.0  43.2   Platelets 150 - 400 K/uL 344  312.0  403     .    Latest Ref Rng & Units 05/15/2023    1:11 PM 05/11/2023    1:21 PM 02/22/2023    9:04 AM  CMP  Glucose 70 - 99 mg/dL 87  77  86   BUN 6 - 20 mg/dL 13  16  10    Creatinine 0.44  - 1.00 mg/dL 9.14  7.82  9.56   Sodium 135 - 145 mmol/L 139  139  139   Potassium 3.5 - 5.1 mmol/L 3.7  4.1  4.4   Chloride 98 - 111 mmol/L 111  105  103   CO2 22 - 32 mmol/L 22  24  20    Calcium 8.9 - 10.3 mg/dL 8.9  9.3  9.4   Total Protein 6.5 - 8.1 g/dL 7.5  7.1  7.5   Total Bilirubin 0.0 - 1.2 mg/dL 0.6  0.3  0.3   Alkaline Phos 38 - 126 U/L 52  59  110   AST 15 - 41 U/L 20  20  16    ALT 0 - 44 U/L 28  26  16       RADIOGRAPHIC STUDIES: I have personally reviewed the radiological images as listed and agreed with the findings in the report. No results found.  ASSESSMENT & PLAN:  23 y.o. female with:  Lymphadenopathy  PLAN:  -CBC from 05/15/2023 showed WBC 6.4K, hgb 12.2, and plt 344K -blood counts unchanged -CMP stable -recent ANA testing negative -Sedimentation rate normal -LDH borderline high, which is not concerning -inflammatory markers are not concerning -During physical examination, I did feel some fairly small lymph nodes in the neck, though not in the anterior triangle of the neck. There is a small lymph node in the left axillary, though it is difficult to say it is enlarged.  -did not feel an enlarged spleen during physical examination -discussed possibility or viral process triggering an immune response causing reactive lymph nodes  -discussed possibility of arthritis involvement -it is possible that autoimmune conditions could be presenting acute from stress -patient is less likely to have a lymph node disorder.  -discussed other rare possibilities involving unusual infections -discussed that in the 54s age population, hodgkin's lymphoma could present with itching -possible that patient may have Still's disease -discussed that there is a role to rule out lymphoma and other known defined autoimmune conditions -discussed option of additional imaging studies such as whole- body scan for further evaluation, especially with bone pain and abdominal pain symptoms -will  schedule  CT neck/chest/abdm/pelvis scan  -discussed that for a notable lymph node, a needle biopsy would be the most definitive test -discussed option of receiving a lower neck biopsy to evaluate more definitively -will schedule US guided biopsy of left cervical lymph node -Will order blood tests today -discussed that if there are no remarkable findings and her symptoms are persistent, there may be a role for recommendation to rheumatology  -recommend connecting with PCP to evaluate whether she may be having a break-thru period while on depo shot.  -discussed that her spotting vaginal bleeding could be from stress -discussed that it is possible to have bleeding breakthroughs between depo shots -discussed option to connect with a counselor to discuss stressors  -answered al of patient's and her mother's questions in detail  FOLLOW-UP: Labs today CT neck/chest/abd/pelvis in 1 week US biopsy left cervical LN in 3-5 days RTC with Dr Candise Che in 2-3 weeks  The total time spent in the appointment was *** minutes* .  All of the patient's questions were answered with apparent satisfaction. The patient knows to call the clinic with any problems, questions or concerns.   Wyvonnia Lora MD MS AAHIVMS [facility] Hematology/Oncology Physician [facility]  .*Total Encounter Time as defined by the Centers for Medicare and Medicaid Services includes, in addition to the face-to-face time of a patient visit (documented in the note above) non-face-to-face time: obtaining and reviewing outside history, ordering and reviewing medications, tests or procedures, care coordination (communications with other health care professionals or caregivers) and documentation in the medical record.    I,Mitra Faeizi,acting as a Neurosurgeon for Wyvonnia Lora, MD.,have documented all relevant documentation on the behalf of Wyvonnia Lora, MD,as directed by  Wyvonnia Lora, MD while in the presence of Wyvonnia Lora, MD.  ***

## 2023-05-26 ENCOUNTER — Inpatient Hospital Stay: Attending: Hematology | Admitting: Hematology

## 2023-05-26 ENCOUNTER — Inpatient Hospital Stay

## 2023-05-26 VITALS — BP 136/83 | HR 91 | Temp 98.2°F | Resp 16 | Wt 209.9 lb

## 2023-05-26 DIAGNOSIS — R590 Localized enlarged lymph nodes: Secondary | ICD-10-CM

## 2023-05-26 DIAGNOSIS — L299 Pruritus, unspecified: Secondary | ICD-10-CM

## 2023-05-26 DIAGNOSIS — R21 Rash and other nonspecific skin eruption: Secondary | ICD-10-CM

## 2023-05-26 LAB — CBC WITH DIFFERENTIAL (CANCER CENTER ONLY)
Abs Immature Granulocytes: 0.01 10*3/uL (ref 0.00–0.07)
Basophils Absolute: 0.1 10*3/uL (ref 0.0–0.1)
Basophils Relative: 2 %
Eosinophils Absolute: 0.2 10*3/uL (ref 0.0–0.5)
Eosinophils Relative: 4 %
HCT: 41 % (ref 36.0–46.0)
Hemoglobin: 13.3 g/dL (ref 12.0–15.0)
Immature Granulocytes: 0 %
Lymphocytes Relative: 45 %
Lymphs Abs: 2.7 10*3/uL (ref 0.7–4.0)
MCH: 26 pg (ref 26.0–34.0)
MCHC: 32.4 g/dL (ref 30.0–36.0)
MCV: 80.2 fL (ref 80.0–100.0)
Monocytes Absolute: 0.4 10*3/uL (ref 0.1–1.0)
Monocytes Relative: 7 %
Neutro Abs: 2.4 10*3/uL (ref 1.7–7.7)
Neutrophils Relative %: 42 %
Platelet Count: 291 10*3/uL (ref 150–400)
RBC: 5.11 MIL/uL (ref 3.87–5.11)
RDW: 15.3 % (ref 11.5–15.5)
WBC Count: 5.8 10*3/uL (ref 4.0–10.5)
nRBC: 0 % (ref 0.0–0.2)

## 2023-05-26 LAB — CMP (CANCER CENTER ONLY)
ALT: 13 U/L (ref 0–44)
AST: 12 U/L — ABNORMAL LOW (ref 15–41)
Albumin: 4.5 g/dL (ref 3.5–5.0)
Alkaline Phosphatase: 63 U/L (ref 38–126)
Anion gap: 5 (ref 5–15)
BUN: 11 mg/dL (ref 6–20)
CO2: 26 mmol/L (ref 22–32)
Calcium: 9.2 mg/dL (ref 8.9–10.3)
Chloride: 108 mmol/L (ref 98–111)
Creatinine: 0.68 mg/dL (ref 0.44–1.00)
GFR, Estimated: >60 mL/min (ref >60)
Glucose, Bld: 104 mg/dL — ABNORMAL HIGH (ref 70–99)
Potassium: 3.9 mmol/L (ref 3.5–5.1)
Sodium: 139 mmol/L (ref 135–145)
Total Bilirubin: 0.3 mg/dL (ref 0.0–1.2)
Total Protein: 7.5 g/dL (ref 6.5–8.1)

## 2023-05-26 LAB — C-REACTIVE PROTEIN: CRP: 0.6 mg/dL (ref <1.0)

## 2023-05-26 LAB — FERRITIN: Ferritin: 8 ng/mL — ABNORMAL LOW (ref 11–307)

## 2023-05-26 LAB — HIV ANTIBODY (ROUTINE TESTING W REFLEX): HIV Screen 4th Generation wRfx: NONREACTIVE

## 2023-05-26 LAB — SEDIMENTATION RATE: Sed Rate: 9 mm/h (ref 0–22)

## 2023-05-27 LAB — RHEUMATOID ARTHRITIS PROFILE
CCP Antibodies IgG/IgA: 11 U (ref 0–19)
Rheumatoid fact SerPl-aCnc: <10 [IU]/mL (ref <14.0)

## 2023-05-27 LAB — RPR: RPR Ser Ql: NONREACTIVE

## 2023-05-28 LAB — TRYPTASE: Tryptase: 6.4 ug/L (ref 2.2–13.2)

## 2023-05-30 ENCOUNTER — Ambulatory Visit (HOSPITAL_COMMUNITY)

## 2023-05-30 ENCOUNTER — Ambulatory Visit (HOSPITAL_COMMUNITY)
Admission: RE | Admit: 2023-05-30 | Discharge: 2023-05-30 | Disposition: A | Discharge status: Home / Self Care | Source: Ambulatory Visit | Attending: Hematology | Admitting: Hematology

## 2023-05-30 DIAGNOSIS — R590 Localized enlarged lymph nodes: Secondary | ICD-10-CM | POA: Diagnosis not present

## 2023-05-30 DIAGNOSIS — R21 Rash and other nonspecific skin eruption: Secondary | ICD-10-CM | POA: Diagnosis not present

## 2023-05-30 DIAGNOSIS — R079 Chest pain, unspecified: Secondary | ICD-10-CM | POA: Diagnosis not present

## 2023-05-30 DIAGNOSIS — L299 Pruritus, unspecified: Secondary | ICD-10-CM | POA: Insufficient documentation

## 2023-05-30 DIAGNOSIS — R591 Generalized enlarged lymph nodes: Secondary | ICD-10-CM | POA: Diagnosis not present

## 2023-05-30 MED ORDER — IOHEXOL 300 MG/ML  SOLN
100.0000 mL | Freq: Once | INTRAMUSCULAR | Status: AC | PRN
  Administered 2023-05-30: 100 mL via INTRAVENOUS

## 2023-05-30 MED ORDER — SODIUM CHLORIDE (PF) 0.9 % IJ SOLN
INTRAMUSCULAR | Status: AC
  Filled 2023-05-30: qty 50

## 2023-05-31 LAB — MULTIPLE MYELOMA PANEL, SERUM
Albumin SerPl Elph-Mcnc: 3.8 g/dL (ref 2.9–4.4)
Albumin/Glob SerPl: 1.3 (ref 0.7–1.7)
Alpha 1: 0.2 g/dL (ref 0.0–0.4)
Alpha2 Glob SerPl Elph-Mcnc: 0.7 g/dL (ref 0.4–1.0)
B-Globulin SerPl Elph-Mcnc: 0.9 g/dL (ref 0.7–1.3)
Gamma Glob SerPl Elph-Mcnc: 1.2 g/dL (ref 0.4–1.8)
Globulin, Total: 3 g/dL (ref 2.2–3.9)
IgA: 176 mg/dL (ref 87–352)
IgG (Immunoglobin G), Serum: 1144 mg/dL (ref 586–1602)
IgM (Immunoglobulin M), Srm: 232 mg/dL — ABNORMAL HIGH (ref 26–217)
Total Protein ELP: 6.8 g/dL (ref 6.0–8.5)

## 2023-06-01 DIAGNOSIS — R5383 Other fatigue: Secondary | ICD-10-CM | POA: Insufficient documentation

## 2023-06-01 DIAGNOSIS — R21 Rash and other nonspecific skin eruption: Secondary | ICD-10-CM | POA: Insufficient documentation

## 2023-06-01 DIAGNOSIS — M25531 Pain in right wrist: Secondary | ICD-10-CM | POA: Insufficient documentation

## 2023-06-01 DIAGNOSIS — R591 Generalized enlarged lymph nodes: Secondary | ICD-10-CM | POA: Insufficient documentation

## 2023-06-01 DIAGNOSIS — N939 Abnormal uterine and vaginal bleeding, unspecified: Secondary | ICD-10-CM | POA: Insufficient documentation

## 2023-06-01 DIAGNOSIS — R1030 Lower abdominal pain, unspecified: Secondary | ICD-10-CM | POA: Insufficient documentation

## 2023-06-01 NOTE — Assessment & Plan Note (Signed)
 Acute, suggestive of lupus

## 2023-06-01 NOTE — Assessment & Plan Note (Signed)
 Chronic, given reports of heavy vaginal bleeding at times will evaluate with lupus anticoagulant.

## 2023-06-01 NOTE — Progress Notes (Signed)
 [name], MD  [name], MD; [name] I don't see adenopathy on the CT  DDH       Previous Messages    ----- Message ----- From: [name] Sent: 05/31/2023  10:31 AM EDT To: [name]; Ir Procedure Requests Subject: [place] Core Biopsy                                Procedure : [place] core biopsy  Reason: core neddle Biopsy of left cervical Lymph node to r/o lymphoma Dx: Cervical lymphadenopathy [R59.0 (ICD-10-CM)]; Generalized rash [R21 (ICD-10-CM)]; Pruritus [L29.9 (ICD-10-CM)]    History : CT Chest abd pelvis /w , CT soft tissue neck w/  Provider : [name], MD  Contact - [phone]

## 2023-06-01 NOTE — Assessment & Plan Note (Addendum)
 Acute, no improvement with antibiotics. Negative viral testing. Concern for possible lupus will do testing with C-reactive protein and ANA. Will move forward with referral to rheumatology. Refer to oncology for further assessment, especially if negative lupus testing.

## 2023-06-04 DIAGNOSIS — Z419 Encounter for procedure for purposes other than remedying health state, unspecified: Secondary | ICD-10-CM | POA: Diagnosis not present

## 2023-06-16 ENCOUNTER — Ambulatory Visit: Payer: Medicaid Other

## 2023-06-20 ENCOUNTER — Telehealth: Payer: Self-pay | Admitting: Family Medicine

## 2023-06-20 NOTE — Telephone Encounter (Signed)
 Dr. Cherlyn Cornet, This patient has seen you a few times for acute issues and would like to establish care with you.  I told her that you were not currently accepting and I would have to ask.  Her PCP on file left the practice, it is outside of [place] so she currently does not have one.  Please advise.  Thanks.  Amy

## 2023-06-21 ENCOUNTER — Ambulatory Visit: Admitting: Family Medicine

## 2023-06-21 NOTE — Telephone Encounter (Signed)
 No, I am only going to accept family members for now.

## 2023-06-21 NOTE — Telephone Encounter (Signed)
 Spoke to patient, relayed message below, Patient declined offer to establish with a provider here that is accepting new patients, said she was going to call her old office back and see who she can establish with there.

## 2023-06-24 ENCOUNTER — Inpatient Hospital Stay: Attending: Hematology | Admitting: Hematology

## 2023-06-24 VITALS — BP 127/77 | HR 100 | Temp 97.5°F | Resp 17 | Ht 65.0 in | Wt 216.5 lb

## 2023-06-24 DIAGNOSIS — R590 Localized enlarged lymph nodes: Secondary | ICD-10-CM | POA: Diagnosis not present

## 2023-06-24 DIAGNOSIS — Z8052 Family history of malignant neoplasm of bladder: Secondary | ICD-10-CM | POA: Insufficient documentation

## 2023-06-24 DIAGNOSIS — L299 Pruritus, unspecified: Secondary | ICD-10-CM | POA: Insufficient documentation

## 2023-06-24 DIAGNOSIS — R21 Rash and other nonspecific skin eruption: Secondary | ICD-10-CM | POA: Insufficient documentation

## 2023-06-24 NOTE — Progress Notes (Signed)
 HEMATOLOGY/ONCOLOGY CLINIC NOTE  Date of Service: 06/24/23  Patient Care Team: Noreene Bearded, PA as PCP - General (Family Medicine)  CHIEF COMPLAINTS/PURPOSE OF CONSULTATION:  Cervical lymphadenopathy  HISTORY OF PRESENTING ILLNESS:   Kayla Nguyen is a wonderful 23 y.o. female who has been referred to us  by Noreene Bearded, PA for evaluation and management of lymphadenopathy.   Today, she is accompanied by her mother. Patient complains of relatively new enlarged neck lymph nodes, which have been swollen for over a month. She reports having on enlarged lymph node in the back of the neck at base of her hair. Patient reports having one enlarged lymph node behind her ear which had resolved. She has a lymph node on her left neck which had been present for 2 weeks.   She does feel enlarged lymph nodes at this time, though they have significantly decreased in size. She notes that one of her right neck lymph nodes was previously quarter-sized and is currently dime-sized. She reports that she does have an enlarged lymph node on the left neck which is bigger in size. Patient denies any concern for scalp itchiness or dandruff.  She denies having any other medical issues in the past. Patient denies any previous reason for enlarged lymph nodes. She denies having any surgeries in the past. Patient has no food allergies or known medication allergies.   Patient is not a smoker and denies any alcohol use outside of social use. She denies any use of recreational chemicals.   She reports that her symptoms began with a mild headache, which presented over 1 month ago, after her father passed away. Patient attributed her headache to stress. Her headache was present in the lower back of her head and was persistent. She presented to urgent care and received blood tests which ruled out lime disease among other factors. Patient was sent to her PCP for additional labs which showed normal findings. Patient  did not receive a scan of the neck or chest.  Patient complains of itchiness mainly in her legs. Patient denies any leg swelling.   She reports hand swelling and redness. Patient also reports itchiness in her palms spreading to her arms.   She complains of severe body pain sometimes, mainly localized in the muscles and joints simultaneouly. Patient reports deep pain in the shoulder, knees, and arms. She reports painful wrist joints with no redness in the area.   She also reports redness across her nose and cheeks.   Patient reports that her body pain and overall swelling in the hands are the most bothersome at this time.   She was not on steroids for any time around these symptoms.   She reports that she has been recently declined by rheumatology for an evaluation.  She complains of lower abnominal pain. She also reports abnormal vaginal spotting bleeding since 2-3 weeks with blood clots. Patient has been on depo shot for 6 months and hasn't had a period since December. Patient gets a Depo shot every 3 months.Patient denies any recent injuries. Her bleeding has not been evaluated by OB/GYN.   Patient denies any change in bowel habits, discomfort passing urine, or other infections recently.   Patient does not use any OTC supplements or new cosmetics/soap products. She reports that she does not tend to have seasonal allergies. 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 She reports no recent travel outside of the [place]  in the last 6 months. Patient  works with [employer].  Patient reports that her father had a hx of RA with mildly high Rh factor. It was noted that her father could not use his hands much though symptoms were not significant. She denies any other fhx of autoimmune condition.   Patient was noted to be wearing a medical boot on her right leg. She reports having a fall on a ramp while making a delivery.   Patient reports that she has previously connected with  a counselor to discuss previous issues, which has been beneficial.  She reports back pain in a local area on palpation as well as lower back pain.   Patient complains of pain across the lower abdomen sometimes, which is also painful with pressure.   She reports pain on palpation to the left axillary. She denies any breast pain or lumps in the breast.   She complains of nausea sometimes in the mornings and evenings present for a few weeks. She denies any vomiting. Patient denies any possibility that she may be pregnant. Patient denies any fever, unexpected sudden weight loss, sore throat, rhinnorhea, or cough.   INTERVAL HISTORY:  Kayla Nguyen is a 23 y.o. female here for continued evaluation and management of Cervical lymphadenopathy.   She was initially seen by me on 05/26/2023 and complained of new enlarged lymph nodes in the neck, persistent headache, itchiness mostly in her legs, severe body pain sometimes, facial redness, hand swelling, lower abdominal pain, vaginal spotting, back pain, left axillary pain with palpation, and nausea sometimes.  Today, she is accompanied by her mother. Patient reports that her cervical lymph nodes are still present, though they fluctuate in size.   Her body pain has been consistent primarily in her center/lower back and shoulders.   Patient reports that she has begun endorsing hair loss especially with every shower, which is a new symptom.   Patient reports that she is involved in the [employer] program and has adjusted her diet accordingly.   She complains of itchiness in her arms and legs, primarily in her shoulders and thighs.   Patient complains of new sudden headaches that are very bothersome. Her pain begins in the back of her head and radiates to her eye. She reports needing to take extra-strength tylenol and lying down to address symptoms.   She denies any fever or diarrhea.   Patient complains of breaking out in hives and skin rashes in  her lower extremities, primarily triggered by heat.   Patient reports that she did receive the MMR vaccine as a child.   She reports generally consistent hand redness and notes that hands swell when she is hot.  Her mother reports that patient's facial rashes are generally the only rashes that persist for longer. Her skin rashes in upper and lower extremities are more intermittent.   She denies any specific environmental allergies.   Patient is currently on birth control. She has been on Medroxyprogesterone  for 6 months. Patient denies her headaches starting around the time she was started on Medroxyprogesterone . She has never been on oral iron.  She reports feeling overall frustrated emotionally.   MEDICAL HISTORY:  No past medical history on file.  SURGICAL HISTORY: No past surgical history on file.  SOCIAL HISTORY: Social History   Socioeconomic History   Marital status: Single    Spouse name: Not on file   Number of children: Not on file   Years of education: Not on file   Highest education level: Not on file  Occupational  History   Not on file  Tobacco Use   Smoking status: Never    Passive exposure: Never   Smokeless tobacco: Never  Vaping Use   Vaping status: Never Used  Substance and Sexual Activity   Alcohol use: No   Drug use: No   Sexual activity: Yes    Birth control/protection: Injection  Other Topics Concern   Not on file  Social History Narrative   Not on file   Social Drivers of Health   Financial Resource Strain: Not on file  Food Insecurity: Not on file  Transportation Needs: Not on file  Physical Activity: Not on file  Stress: Not on file  Social Connections: Not on file  Intimate Partner Violence: Not on file    FAMILY HISTORY: Family History  Problem Relation Age of Onset   Polycythemia Mother    Diabetes Father    Prostate cancer Father    Bladder Cancer Father    Diabetes Sister    Diabetes Paternal Grandfather     ALLERGIES:   has no known allergies.  MEDICATIONS:  Current Outpatient Medications  Medication Sig Dispense Refill   medroxyPROGESTERone  (DEPO-PROVERA ) 150 MG/ML injection Inject 1 mL (150 mg total) into the muscle every 3 (three) months. 1 mL 3   traZODone  (DESYREL ) 50 MG tablet Take 1 tablet (50 mg total) by mouth at bedtime. (Patient not taking: Reported on 05/26/2023) 30 tablet 1   Current Facility-Administered Medications  Medication Dose Route Frequency Provider Last Rate Last Admin   medroxyPROGESTERone  (DEPO-PROVERA ) injection 150 mg  150 mg Intramuscular Q90 days Maryclare Smoke A, PA   150 mg at 01/06/23 1349    REVIEW OF SYSTEMS:    10 Point review of Systems was done is negative except as noted above.   PHYSICAL EXAMINATION: ECOG PERFORMANCE STATUS: 1 - Symptomatic but completely ambulatory  . Vitals:   06/24/23 1437  BP: 127/77  Pulse: 100  Resp: 17  Temp: (!) 97.5 F (36.4 C)  SpO2: 100%    Filed Weights   06/24/23 1437  Weight: 216 lb 8 oz (98.2 kg)    .Body mass index is 36.03 kg/m.   GENERAL:alert, in no acute distress and comfortable SKIN: no acute rashes, no significant lesions EYES: conjunctiva are pink and non-injected, sclera anicteric OROPHARYNX: MMM, no exudates, no oropharyngeal erythema or ulceration NECK: supple, no JVD LYMPH:  no palpable lymphadenopathy in the cervical, axillary or inguinal regions LUNGS: clear to auscultation b/l with normal respiratory effort HEART: regular rate & rhythm ABDOMEN:  normoactive bowel sounds , non tender, not distended. Extremity: no pedal edema PSYCH: alert & oriented x 3 with fluent speech NEURO: no focal motor/sensory deficits   LABORATORY DATA:  I have reviewed the data as listed  .    Latest Ref Rng & Units 06/30/2023    4:50 PM 05/26/2023   12:46 PM 05/15/2023    1:11 PM  CBC  WBC 4.0 - 10.5 K/uL 12.1  5.8  6.4   Hemoglobin 12.0 - 15.0 g/dL 16.1  09.6  04.5   Hematocrit 36.0 - 46.0 % 42.4  41.0  37.5    Platelets 150 - 400 K/uL 402  291  344     .    Latest Ref Rng & Units 06/30/2023    4:50 PM 05/26/2023   12:46 PM 05/15/2023    1:11 PM  CMP  Glucose 70 - 99 mg/dL 98  409  87   BUN 6 - 20 mg/dL  15  11  13    Creatinine 0.44 - 1.00 mg/dL 1.61  0.96  0.45   Sodium 135 - 145 mmol/L 140  139  139   Potassium 3.5 - 5.1 mmol/L 3.8  3.9  3.7   Chloride 98 - 111 mmol/L 104  108  111   CO2 22 - 32 mmol/L 20  26  22    Calcium 8.9 - 10.3 mg/dL 9.7  9.2  8.9   Total Protein 6.5 - 8.1 g/dL 7.7  7.5  7.5   Total Bilirubin 0.0 - 1.2 mg/dL <4.0  0.3  0.6   Alkaline Phos 38 - 126 U/L 84  63  52   AST 15 - 41 U/L 16  12  20    ALT 0 - 44 U/L 18  13  28       RADIOGRAPHIC STUDIES: I have personally reviewed the radiological images as listed and agreed with the findings in the report. CT Soft Tissue Neck W Contrast Result Date: 06/22/2023 CLINICAL DATA:  Cervical lymphadenopathy EXAM: CT NECK WITH CONTRAST TECHNIQUE: Multidetector CT imaging of the neck was performed using the standard protocol following the bolus administration of intravenous contrast. RADIATION DOSE REDUCTION: This exam was performed according to the departmental dose-optimization program which includes automated exposure control, adjustment of the mA and/or kV according to patient size and/or use of iterative reconstruction technique. CONTRAST:  OMNIPAQUE  IOHEXOL  300 MG/ML  SOLN COMPARISON:  None Available. FINDINGS: Pharynx and larynx: Normal. No mass or swelling. Salivary glands: No inflammation, mass, or stone. Thyroid : Normal. Lymph nodes: Several prominent intraparotid and cervical lymph nodes bilaterally. Index left level 2A lymph node measures 1.6 cm (series 3 image 66) additional index right level 2A lymph node measures 1.3 cm (series 3 image 71). Vascular: Negative. Limited intracranial: Negative. Visualized orbits: Negative. Mastoids and visualized paranasal sinuses: Clear. Skeleton: No acute or aggressive process. Upper  chest: Negative. Other: None. IMPRESSION: Several prominent intraparotid and cervical lymph nodes bilaterally. Findings could be reactive or inflammatory, less likely neoplastic. Recommend follow-up in 3-6 months to assess for change. Electronically Signed   By: Johnanna Mylar M.D.   On: 06/22/2023 10:27   CT CHEST ABDOMEN PELVIS W CONTRAST Result Date: 05/30/2023 CLINICAL DATA:  Cervical and axillary lymphadenopathy, chest pain, query lymphoma * Tracking Code: BO * EXAM: CT CHEST, ABDOMEN, AND PELVIS WITH CONTRAST TECHNIQUE: Multidetector CT imaging of the chest, abdomen and pelvis was performed following the standard protocol during bolus administration of intravenous contrast. RADIATION DOSE REDUCTION: This exam was performed according to the departmental dose-optimization program which includes automated exposure control, adjustment of the mA and/or kV according to patient size and/or use of iterative reconstruction technique. CONTRAST:  OMNIPAQUE  IOHEXOL  300 MG/ML  SOLN COMPARISON:  None Available. FINDINGS: CT CHEST FINDINGS Cardiovascular: No significant vascular findings. Normal heart size. No pericardial effusion. Mediastinum/Nodes: No enlarged mediastinal, hilar, or axillary lymph nodes. Thyroid  gland, trachea, and esophagus demonstrate no significant findings. Lungs/Pleura: Lungs are clear. No pleural effusion or pneumothorax. Musculoskeletal: No chest wall abnormality. No acute osseous findings. CT ABDOMEN PELVIS FINDINGS Hepatobiliary: No solid liver abnormality is seen. No gallstones, gallbladder wall thickening, or biliary dilatation. Pancreas: Unremarkable. No pancreatic ductal dilatation or surrounding inflammatory changes. Spleen: Normal in size without significant abnormality. Adrenals/Urinary Tract: Adrenal glands are unremarkable. Kidneys are normal, without renal calculi, solid lesion, or hydronephrosis. Bladder is unremarkable. Stomach/Bowel: Stomach is within normal limits. Appendix  appears normal. No evidence of bowel wall thickening, distention, or  inflammatory changes. Vascular/Lymphatic: No significant vascular findings are present. Mildly prominent small bowel mesenteric lymph nodes measuring up 2 1.9 x 0.6 cm (series 9, image 55, 58). Reproductive: No mass or other abnormality. Numerous small functional ovarian follicles, benign, requiring no further follow-up and characterization. Other: No abdominal wall hernia or abnormality. No ascites. Musculoskeletal: No acute osseous findings. IMPRESSION: 1. Mildly prominent small bowel mesenteric lymph nodes, nonspecific, possibly reactive. 2. No mass or overt lymphadenopathy in the chest, abdomen, or pelvis. Normal spleen. 3. No acute CT findings. Electronically Signed   By: Fredricka Jenny M.D.   On: 05/30/2023 11:29    ASSESSMENT & PLAN:   23 y.o. female with:  Cervical Lymphadenopathy - significantly improved with time. Likely reactive ? Passing viral infection vs inflammatory disorder.  PLAN:  -Discussed lab results on 06/24/23 in detail with patient. CBC normal, showed WBC of 5.8K, hemoglobin of 13.3, and platelets of 291K. -CMP shows normal liver and kidney function and electrolytes WNL -patient noted to be very iron deficient with ferritin 8, which could explain her hair loss along with potential psychological stress. Low ferritin can also cause fatigue, changes in skin, brittle nails, and headaches.  -discussed ferritin goal of at least 50 -discussed that there are no findings of abnormal monoclonal antibody -RA testing not revealing -tryptase levels normal -C-reactive protein levels normal -RPR test non reactive -sedimentation rate normal -Thyroid  function testing was normal -discussed that her recent blood tests were not overall very revealing and there is no suggestion of systemic inflammation -CT chest/abdm/pelvis showed no major masses or overt lymphadenopathy in the chest, abdomen, or pelvis. There were findings  of borderline lymph nodes in the upper limits of normal in the abdomen which appeared to be reactive. There are no other obvious masses. Spleen noted to be normal in size.  -CT neck showed several small prominent intraparotid and cervical lymph nodes bilaterally, which did not appear to be malignant. Lymph nodes were found to be in the upper limits of normal. Discussed that this finding can sometimes be seen with certain inflammatory disorders, viral infections, or other reactive/inflammatory process. -educated patient that a passing viral infection can produce symptoms not just from the infection itself but our body's reaction to the infection, in which antibodies are misdirected, causing multi-system symptoms. Discussed that these symptoms generally tend to settle down in 2-3 months. Also possible that symptoms are related to allergy response.  -discussed  possibility that there could be involvement of an inflammatory process. No major systemic inflammation, which is reassuring -recommend taking 100-150 MG iron polysaccharide daily in liquid or pill form if she can tolerate. It is okay to intially try taking it 3x week: MWF. Recommend taking oral iron with a shot-glass of orange juice to support absorption.  -recommend taking vitamin B complex temporarily for at least 3 months -advised patient to continue to monitor symptoms and let us  know if her symptoms worsen  -discussed option to repeat images in 3-6 months if her symptoms are persistent -recommend taking showers with luke-warm water. Avoid extreme temperatures during showers. -recommend physical exercise-at least 30 minutes of walking daily   FOLLOW-UP: RTC with Dr Salomon Cree with labs in 4 months  The total time spent in the appointment was 20 minutes* .  All of the patient's questions were answered with apparent satisfaction. The patient knows to call the clinic with any problems, questions or concerns.   Jacquelyn Matt MD MS AAHIVMS [facility]  [facility] Hematology/Oncology Physician [facility]  .*  Total Encounter Time as defined by the Centers for Medicare and Medicaid Services includes, in addition to the face-to-face time of a patient visit (documented in the note above) non-face-to-face time: obtaining and reviewing outside history, ordering and reviewing medications, tests or procedures, care coordination (communications with other health care professionals or caregivers) and documentation in the medical record.    I,Mitra Faeizi,acting as a Neurosurgeon for Jacquelyn Matt, MD.,have documented all relevant documentation on the behalf of Jacquelyn Matt, MD,as directed by  Jacquelyn Matt, MD while in the presence of Jacquelyn Matt, MD.  .I have reviewed the above documentation for accuracy and completeness, and I agree with the above. .Tahari Clabaugh Kishore Guilianna Mckoy MD

## 2023-06-27 ENCOUNTER — Telehealth: Payer: Self-pay | Admitting: *Deleted

## 2023-06-27 ENCOUNTER — Other Ambulatory Visit (HOSPITAL_COMMUNITY)

## 2023-06-27 NOTE — Telephone Encounter (Signed)
 Copied from CRM [id number]. Topic: Clinical - Medical Advice >> Jun 27, 2023  9:08 AM Kayla Nguyen T wrote: Reason for CRM: patient called said she is having back pain over the past 2 days and also want to discuss birth control.

## 2023-06-27 NOTE — Telephone Encounter (Signed)
 Contacted pt and appointment scheduled.

## 2023-06-28 DIAGNOSIS — M9915 Subluxation complex (vertebral) of pelvic region: Secondary | ICD-10-CM | POA: Diagnosis not present

## 2023-06-28 DIAGNOSIS — M9914 Subluxation complex (vertebral) of sacral region: Secondary | ICD-10-CM | POA: Diagnosis not present

## 2023-06-28 DIAGNOSIS — M9913 Subluxation complex (vertebral) of lumbar region: Secondary | ICD-10-CM | POA: Diagnosis not present

## 2023-06-30 ENCOUNTER — Other Ambulatory Visit: Payer: Self-pay

## 2023-06-30 ENCOUNTER — Inpatient Hospital Stay (HOSPITAL_BASED_OUTPATIENT_CLINIC_OR_DEPARTMENT_OTHER)
Admission: EM | Admit: 2023-06-30 | Discharge: 2023-07-01 | DRG: 880 | Discharge status: Against Medical Advice | Attending: Internal Medicine | Admitting: Internal Medicine

## 2023-06-30 ENCOUNTER — Encounter (HOSPITAL_BASED_OUTPATIENT_CLINIC_OR_DEPARTMENT_OTHER): Payer: Self-pay

## 2023-06-30 ENCOUNTER — Emergency Department (HOSPITAL_BASED_OUTPATIENT_CLINIC_OR_DEPARTMENT_OTHER)

## 2023-06-30 DIAGNOSIS — R251 Tremor, unspecified: Secondary | ICD-10-CM | POA: Diagnosis present

## 2023-06-30 DIAGNOSIS — F410 Panic disorder [episodic paroxysmal anxiety] without agoraphobia: Secondary | ICD-10-CM | POA: Diagnosis present

## 2023-06-30 DIAGNOSIS — F321 Major depressive disorder, single episode, moderate: Secondary | ICD-10-CM | POA: Diagnosis present

## 2023-06-30 DIAGNOSIS — R569 Unspecified convulsions: Secondary | ICD-10-CM | POA: Diagnosis not present

## 2023-06-30 DIAGNOSIS — Z820 Family history of epilepsy and other diseases of the nervous system: Secondary | ICD-10-CM

## 2023-06-30 DIAGNOSIS — F445 Conversion disorder with seizures or convulsions: Principal | ICD-10-CM | POA: Diagnosis present

## 2023-06-30 DIAGNOSIS — R9431 Abnormal electrocardiogram [ECG] [EKG]: Secondary | ICD-10-CM | POA: Diagnosis not present

## 2023-06-30 DIAGNOSIS — E66812 Obesity, class 2: Secondary | ICD-10-CM | POA: Diagnosis present

## 2023-06-30 DIAGNOSIS — Z6836 Body mass index (BMI) 36.0-36.9, adult: Secondary | ICD-10-CM

## 2023-06-30 DIAGNOSIS — Z793 Long term (current) use of hormonal contraceptives: Secondary | ICD-10-CM

## 2023-06-30 DIAGNOSIS — F411 Generalized anxiety disorder: Secondary | ICD-10-CM | POA: Diagnosis present

## 2023-06-30 DIAGNOSIS — D72829 Elevated white blood cell count, unspecified: Secondary | ICD-10-CM | POA: Diagnosis present

## 2023-06-30 DIAGNOSIS — Z79899 Other long term (current) drug therapy: Secondary | ICD-10-CM

## 2023-06-30 DIAGNOSIS — R590 Localized enlarged lymph nodes: Secondary | ICD-10-CM | POA: Diagnosis present

## 2023-06-30 DIAGNOSIS — Z634 Disappearance and death of family member: Secondary | ICD-10-CM

## 2023-06-30 DIAGNOSIS — E872 Acidosis, unspecified: Secondary | ICD-10-CM | POA: Diagnosis present

## 2023-06-30 DIAGNOSIS — R0789 Other chest pain: Secondary | ICD-10-CM | POA: Diagnosis present

## 2023-06-30 HISTORY — DX: Unspecified convulsions: R56.9

## 2023-06-30 LAB — URINALYSIS, ROUTINE W REFLEX MICROSCOPIC
Bilirubin Urine: NEGATIVE
Glucose, UA: NEGATIVE mg/dL
Hgb urine dipstick: NEGATIVE
Ketones, ur: NEGATIVE mg/dL
Leukocytes,Ua: NEGATIVE
Nitrite: NEGATIVE
Protein, ur: NEGATIVE mg/dL
Specific Gravity, Urine: 1.007 (ref 1.005–1.030)
pH: 5.5 (ref 5.0–8.0)

## 2023-06-30 LAB — CBC
HCT: 42.4 % (ref 36.0–46.0)
Hemoglobin: 13.7 g/dL (ref 12.0–15.0)
MCH: 26.6 pg (ref 26.0–34.0)
MCHC: 32.3 g/dL (ref 30.0–36.0)
MCV: 82.3 fL (ref 80.0–100.0)
Platelets: 402 10*3/uL — ABNORMAL HIGH (ref 150–400)
RBC: 5.15 MIL/uL — ABNORMAL HIGH (ref 3.87–5.11)
RDW: 14.3 % (ref 11.5–15.5)
WBC: 12.1 10*3/uL — ABNORMAL HIGH (ref 4.0–10.5)
nRBC: 0 % (ref 0.0–0.2)

## 2023-06-30 LAB — URINE DRUG SCREEN
Amphetamines: NOT DETECTED
Barbiturates: NOT DETECTED
Benzodiazepines: NOT DETECTED
Cocaine: NOT DETECTED
Fentanyl: NOT DETECTED
Methadone Scn, Ur: NOT DETECTED
Opiates: NOT DETECTED
Tetrahydrocannabinol: NOT DETECTED

## 2023-06-30 LAB — COMPREHENSIVE METABOLIC PANEL WITH GFR
ALT: 18 U/L (ref 0–44)
AST: 16 U/L (ref 15–41)
Albumin: 4.8 g/dL (ref 3.5–5.0)
Alkaline Phosphatase: 84 U/L (ref 38–126)
Anion gap: 16 — ABNORMAL HIGH (ref 5–15)
BUN: 15 mg/dL (ref 6–20)
CO2: 20 mmol/L — ABNORMAL LOW (ref 22–32)
Calcium: 9.7 mg/dL (ref 8.9–10.3)
Chloride: 104 mmol/L (ref 98–111)
Creatinine, Ser: 0.81 mg/dL (ref 0.44–1.00)
GFR, Estimated: >60 mL/min (ref >60)
Glucose, Bld: 98 mg/dL (ref 70–99)
Potassium: 3.8 mmol/L (ref 3.5–5.1)
Sodium: 140 mmol/L (ref 135–145)
Total Bilirubin: <0.2 mg/dL (ref 0.0–1.2)
Total Protein: 7.7 g/dL (ref 6.5–8.1)

## 2023-06-30 LAB — TSH: TSH: 3.61 u[IU]/mL (ref 0.350–4.500)

## 2023-06-30 LAB — CBG MONITORING, ED: Glucose-Capillary: 113 mg/dL — ABNORMAL HIGH (ref 70–99)

## 2023-06-30 LAB — TROPONIN T, HIGH SENSITIVITY: Troponin T High Sensitivity: <15 ng/L (ref <19)

## 2023-06-30 LAB — HCG, SERUM, QUALITATIVE: Preg, Serum: NEGATIVE

## 2023-06-30 MED ORDER — LORAZEPAM 2 MG/ML IJ SOLN
1.0000 mg | Freq: Once | INTRAMUSCULAR | Status: AC
Start: 2023-06-30 — End: 2023-06-30
  Administered 2023-06-30: 1 mg via INTRAVENOUS
  Filled 2023-06-30: qty 1

## 2023-06-30 NOTE — ED Provider Notes (Signed)
 [place] EMERGENCY DEPARTMENT AT [facility] Provider Note   CSN: [id number] Arrival date & time: 06/30/23  1630     History Cervical lymphadenopathy  Chief Complaint  Patient presents with   Tremors   Loss of Consciousness    Kayla Nguyen is a 23 y.o. female.  23 year old female with a PMH cervical lymphadenopathy presents to the ED with a chief complaint of seizures x 1 week.  Patient reports for the past week she is been waking up with a lot of pressure in her chest, states that last night the same thing happened that woke her up in the middle the night, felt that her head and arms were shaking.  She continued to feel chest heaviness throughout the day, reports that she also had stomach pain at the same time this happened.  This episode happened for approximately 15 minutes.  She also felt like she was having some back pain along with stomach pain, and felt like she was "zoning in and now ".  Her sister at the bedside is providing some collateral history, states that she looked at her sister and she was flushed, reports that her head started shaking, then later her upper body began shaking.  This is just like her father prior history of seizure.  Patient reports she did not lose consciousness during the episode.  Her father does have a history of epilepsy along with absent seizures.  She has been having 2 to 3 weeks of chest pressure, was been seen by Dr. Salomon Cree who thought that she may have lupus, however all blood work has been negative.  She continues to have swollen lymph nodes, reports there is red splotchy face.  She just reports now feeling very tired.  She is unsure whether she has an autoimmune disorder.  No CAD, no tobacco use, Depo for birth control.  The history is provided by the patient.  Loss of Consciousness Episode history:  Single Timing:  Constant Progression:  Unchanged Chronicity:  New Context: normal activity   Witnessed: yes   Relieved by:   Nothing Worsened by:  Nothing Ineffective treatments:  None tried Associated symptoms: chest pain and seizures   Associated symptoms: no fever, no headaches, no nausea, no shortness of breath and no vomiting   Risk factors: no congenital heart disease, no coronary artery disease, no seizures and no vascular disease        Home Medications Prior to Admission medications   Medication Sig Start Date End Date Taking? Authorizing Provider  medroxyPROGESTERone  (DEPO-PROVERA ) 150 MG/ML injection Inject 1 mL (150 mg total) into the muscle every 3 (three) months. 12/27/22   Noreene Bearded, PA  traZODone  (DESYREL ) 50 MG tablet Take 1 tablet (50 mg total) by mouth at bedtime. Patient not taking: Reported on 05/26/2023 05/15/23   Phyliss Breen, PA-C      Allergies    Patient has no known allergies.    Review of Systems   Review of Systems  Constitutional:  Negative for chills and fever.  Respiratory:  Negative for shortness of breath.   Cardiovascular:  Positive for chest pain and syncope.  Gastrointestinal:  Negative for abdominal pain, nausea and vomiting.  Genitourinary:  Negative for flank pain.  Musculoskeletal:  Negative for back pain and myalgias.  Neurological:  Positive for seizures. Negative for syncope, light-headedness, numbness and headaches.  All other systems reviewed and are negative.   Physical Exam Updated Vital Signs BP (!) 109/90   Pulse 83  Temp 98.7 F (37.1 C) (Oral)   Resp 18   Ht 5\' 5"  (1.651 m)   Wt 98.2 kg   SpO2 100%   BMI 36.03 kg/m  Physical Exam Vitals and nursing note reviewed.  Constitutional:      Appearance: Normal appearance.  HENT:     Head: Normocephalic and atraumatic.     Mouth/Throat:     Mouth: Mucous membranes are moist.  Eyes:     Pupils: Pupils are equal, round, and reactive to light.  Cardiovascular:     Rate and Rhythm: Normal rate.  Pulmonary:     Effort: Pulmonary effort is normal.     Breath sounds: No wheezing or  rales.  Abdominal:     General: Abdomen is flat.     Tenderness: There is no abdominal tenderness.  Musculoskeletal:     Cervical back: Normal range of motion and neck supple.  Skin:    General: Skin is warm and dry.  Neurological:     Mental Status: She is alert and oriented to person, place, and time.     Comments: Alert, oriented, thought content appropriate. Speech fluent without evidence of aphasia. Able to follow 2 step commands without difficulty.  Cranial Nerves:  II:  Peripheral visual fields grossly normal, pupils, round, reactive to light III,IV, VI: ptosis not present, extra-ocular motions intact bilaterally  V,VII: smile symmetric, facial light touch sensation equal VIII: hearing grossly normal bilaterally  IX,X: midline uvula rise  XI: bilateral shoulder shrug equal and strong XII: midline tongue extension  Motor:  5/5 in upper and lower extremities bilaterally including strong and equal grip strength and dorsiflexion/plantar flexion Sensory: light touch normal in all extremities.  Cerebellar: normal finger-to-nose with bilateral upper extremities, pronator drift negative Gait: N/A       ED Results / Procedures / Treatments   Labs (all labs ordered are listed, but only abnormal results are displayed) Labs Reviewed  COMPREHENSIVE METABOLIC PANEL WITH GFR - Abnormal; Notable for the following components:      Result Value   CO2 20 (*)    Anion gap 16 (*)    All other components within normal limits  CBC - Abnormal; Notable for the following components:   WBC 12.1 (*)    RBC 5.15 (*)    Platelets 402 (*)    All other components within normal limits  URINALYSIS, ROUTINE W REFLEX MICROSCOPIC - Abnormal; Notable for the following components:   Color, Urine COLORLESS (*)    All other components within normal limits  CBG MONITORING, ED - Abnormal; Notable for the following components:   Glucose-Capillary 113 (*)    All other components within normal limits  HCG,  SERUM, QUALITATIVE  TSH  URINE DRUG SCREEN  TROPONIN T, HIGH SENSITIVITY    EKG None  Radiology CT Head Wo Contrast Result Date: 06/30/2023 CLINICAL DATA:  New onset seizure activity EXAM: CT HEAD WITHOUT CONTRAST TECHNIQUE: Contiguous axial images were obtained from the base of the skull through the vertex without intravenous contrast. RADIATION DOSE REDUCTION: This exam was performed according to the departmental dose-optimization program which includes automated exposure control, adjustment of the mA and/or kV according to patient size and/or use of iterative reconstruction technique. COMPARISON:  None Available. FINDINGS: Brain: No evidence of acute infarction, hemorrhage, hydrocephalus, extra-axial collection or mass lesion/mass effect. Vascular: No hyperdense vessel or unexpected calcification. Skull: Normal. Negative for fracture or focal lesion. Sinuses/Orbits: No acute finding. Other: None. IMPRESSION: No acute intracranial abnormality  noted. Electronically Signed   By: Violeta Grey M.D.   On: 06/30/2023 19:54    Procedures Procedures    Medications Ordered in ED Medications  LORazepam  (ATIVAN ) injection 1 mg (1 mg Intravenous Given 06/30/23 1906)    ED Course/ Medical Decision Making/ A&P                                 Medical Decision Making Amount and/or Complexity of Data Reviewed Labs: ordered. Radiology: ordered.  Risk Prescription drug management.   This patient presents to the ED for concern of seizure, this involves a number of treatment options, and is a complaint that carries with it a high risk of complications and morbidity.  The differential diagnosis includes seizure, electrolyte derangement versus intracranial pathology.   Co morbidities: Discussed in HPI   Brief History:  See HPI.   EMR reviewed including pt PMHx, past surgical history and past visits to ER.   See HPI for more details   Lab Tests:  I ordered and independently interpreted  labs.  The pertinent results include:    I personally reviewed all laboratory work and imaging. Metabolic panel without any acute abnormality specifically kidney function within normal limits and no significant electrolyte abnormalities. CBC without leukocytosis or significant anemia. TSH is within normal limits.  Pregnancy test is negative.  UDS is negative for any substance.  Troponin is less than 15, will not obtain delta as she felt that chest pressure yesterday.   Imaging Studies:  CT head has been ordered to rule out any intracranial pathology. CT head without any acute findings at this time.  Cardiac Monitoring:  The patient was maintained on a cardiac monitor.  I personally viewed and interpreted the cardiac monitored which showed an underlying rhythm of:NSR EKG non-ischemic   Medicines ordered:  I ordered medication including ativan   for seizure like activity  Reevaluation of the patient after these medicines showed that the patient improved I have reviewed the patients home medicines and have made adjustments as needed  Consults:  I requested consultation with Neurology,  and discussed lab and imaging findings as well as pertinent plan - they recommend: Admission for EEG, along with further workup for new onset seizure-like activity.   Reevaluation:  After the interventions noted above I re-evaluated patient and found that they have :improved   Social Determinants of Health:  The patient's social determinants of health were a factor in the care of this patient  Problem List / ED Course:  Patient presented to the ED with a chief complaint of suspected seizure-like activity.  Reports that she was becoming minimally responsive over the last couple of days.  She describes the episodes starting with pressure in her chest, then later turned into shaking of her head, then shaking of her upper body.  According to sister at the bedside who was providing collateral information,  they report that her father suffer from the same.  Patient has been evaluated previously according to records by Dr. Salomon Cree for lymph nodes that she has had behind her ear, she also reports she was told by her PCP to "become 1 with the universe ".  She reports today that she feels disoriented, she did not lose consciousness during the episode, no incontinence, did not bite her tongue.  Neuroexam is unremarkable here.  Vital signs are within normal limits, she does have full range of motion of her neck without  any meningeal signs.  Blood work here was unremarkable, slight leukocytosis of 12.1, hemoglobin is normal.  CMP with no electrolyte derangement, LFTs are within normal limits.  Pregnancy test is negative.  UDS without any nitrites or leukocytes to suggest infection.  TSH is normal. I was in the room, sister at the bedside reports that patient had another "seizure-like activity ", patient's heart rate did spike up to 110, I was told by them that they she has never seen cardiology in the past, mother is requesting an MRI here, she was made aware that we do not have that capacity at this time. Patient has been observed here for 3 hours, she did have that 1 isolated episode, I did not witness any actual shaking, her heart rate did spike up, I did provide patient with some Ativan  1 mg. There was no incontinence,no whole body involvement observed by me.  I did discussed this case with my attending Dr. Leighton Punches who recommended neurology involvement.  Spoke to Dr. Michell Ahumada who will admit patient for further management.` `  Dispostion:  Patient will need transfer over to [facility] to be at hospitals in order to obtain further workup for new onset of seizures.   Portions of this note were generated with Scientist, clinical (histocompatibility and immunogenetics). Dictation errors may occur despite best attempts at proofreading.  Final Clinical Impression(s) / ED Diagnoses Final diagnoses:  Seizure-like activity [facility])    Rx / DC Orders ED  Discharge Orders     None         Candas Deemer, PA-C 06/30/23 2103    Lind Repine, MD 07/01/23 2240

## 2023-06-30 NOTE — ED Notes (Signed)
 Pt aware of the need for a urine... Unable to currently provide the sample.Kayla KitchenMarland Nguyen

## 2023-06-30 NOTE — Plan of Care (Signed)
 Plan of Care Note for accepted transfer   Patient name: Kayla Nguyen KGM:[id number] DOB: 05/06/2000  Facility requesting transfer: [facility] ED Requesting Provider: Soto, Johana, PA-C  Reason for transfer: Seizure-like activity, chest heaviness Facility course: 23 year old female with history of cervical lymphadenopathy presented to the ED for evaluation of seizure-like activity and chest heaviness x 1 week.  Afebrile.  Not tachycardic or hypoxic.  EKG without acute ischemic changes.  Labs notable for WBC count 12.1, sodium 140, bicarb 20, glucose 98, creatinine 0.8, normal LFTs, normal TSH, normal troponin x1, UA not suggestive of infection, UDS negative.  CT head negative for acute intracranial abnormality. Patient had an episode of becoming slightly tachycardic and some twitching and was given Ativan 1 mg x 1.  She had no incontinence during this episode and was not postictal afterwards.  ED PA spoke to Dr. Veleta Gerold with neurology who recommended admission for EEG and further workup.  Plan of care: The patient is accepted for admission to Telemetry unit at [facility].  [facility] will assume care on arrival to accepting facility. Until arrival, care as per EDP. However, [facility] available 24/7 for questions and assistance.  Check [email or web address] for on-call coverage.  Nursing staff, please call [facility] Admits & Consults System-Wide number under Amion on patient's arrival so appropriate admitting provider can evaluate the pt.

## 2023-06-30 NOTE — ED Triage Notes (Addendum)
 Patient arrives POV with complaints of having episodes of suspected seizures (becoming minimally responsive with tremors witnessed by family). Patient reports being disoriented. Symptoms have been intermittent x1 week.

## 2023-07-01 ENCOUNTER — Inpatient Hospital Stay (HOSPITAL_COMMUNITY)

## 2023-07-01 ENCOUNTER — Encounter (HOSPITAL_COMMUNITY): Payer: Self-pay | Admitting: Internal Medicine

## 2023-07-01 ENCOUNTER — Observation Stay (HOSPITAL_COMMUNITY)

## 2023-07-01 DIAGNOSIS — Z793 Long term (current) use of hormonal contraceptives: Secondary | ICD-10-CM | POA: Diagnosis not present

## 2023-07-01 DIAGNOSIS — Z79899 Other long term (current) drug therapy: Secondary | ICD-10-CM | POA: Diagnosis not present

## 2023-07-01 DIAGNOSIS — R590 Localized enlarged lymph nodes: Secondary | ICD-10-CM | POA: Diagnosis not present

## 2023-07-01 DIAGNOSIS — F445 Conversion disorder with seizures or convulsions: Secondary | ICD-10-CM | POA: Diagnosis not present

## 2023-07-01 DIAGNOSIS — F321 Major depressive disorder, single episode, moderate: Secondary | ICD-10-CM | POA: Diagnosis present

## 2023-07-01 DIAGNOSIS — Z634 Disappearance and death of family member: Secondary | ICD-10-CM | POA: Diagnosis not present

## 2023-07-01 DIAGNOSIS — R251 Tremor, unspecified: Secondary | ICD-10-CM | POA: Diagnosis not present

## 2023-07-01 DIAGNOSIS — F410 Panic disorder [episodic paroxysmal anxiety] without agoraphobia: Secondary | ICD-10-CM | POA: Diagnosis not present

## 2023-07-01 DIAGNOSIS — R569 Unspecified convulsions: Secondary | ICD-10-CM | POA: Diagnosis not present

## 2023-07-01 DIAGNOSIS — E872 Acidosis, unspecified: Secondary | ICD-10-CM | POA: Diagnosis not present

## 2023-07-01 DIAGNOSIS — F411 Generalized anxiety disorder: Secondary | ICD-10-CM | POA: Diagnosis not present

## 2023-07-01 DIAGNOSIS — E66812 Obesity, class 2: Secondary | ICD-10-CM | POA: Diagnosis not present

## 2023-07-01 DIAGNOSIS — R0789 Other chest pain: Secondary | ICD-10-CM | POA: Diagnosis not present

## 2023-07-01 DIAGNOSIS — Z6836 Body mass index (BMI) 36.0-36.9, adult: Secondary | ICD-10-CM | POA: Diagnosis not present

## 2023-07-01 DIAGNOSIS — D72829 Elevated white blood cell count, unspecified: Secondary | ICD-10-CM | POA: Diagnosis not present

## 2023-07-01 DIAGNOSIS — Z0389 Encounter for observation for other suspected diseases and conditions ruled out: Secondary | ICD-10-CM | POA: Diagnosis not present

## 2023-07-01 DIAGNOSIS — Z820 Family history of epilepsy and other diseases of the nervous system: Secondary | ICD-10-CM | POA: Diagnosis not present

## 2023-07-01 LAB — CBC
HCT: 38 % (ref 36.0–46.0)
Hemoglobin: 12.4 g/dL (ref 12.0–15.0)
MCH: 26.8 pg (ref 26.0–34.0)
MCHC: 32.6 g/dL (ref 30.0–36.0)
MCV: 82.3 fL (ref 80.0–100.0)
Platelets: 331 10*3/uL (ref 150–400)
RBC: 4.62 MIL/uL (ref 3.87–5.11)
RDW: 14.4 % (ref 11.5–15.5)
WBC: 9.2 10*3/uL (ref 4.0–10.5)
nRBC: 0 % (ref 0.0–0.2)

## 2023-07-01 LAB — BASIC METABOLIC PANEL WITH GFR
Anion gap: 10 (ref 5–15)
BUN: 13 mg/dL (ref 6–20)
CO2: 21 mmol/L — ABNORMAL LOW (ref 22–32)
Calcium: 9 mg/dL (ref 8.9–10.3)
Chloride: 108 mmol/L (ref 98–111)
Creatinine, Ser: 0.74 mg/dL (ref 0.44–1.00)
GFR, Estimated: >60 mL/min (ref >60)
Glucose, Bld: 95 mg/dL (ref 70–99)
Potassium: 3.8 mmol/L (ref 3.5–5.1)
Sodium: 139 mmol/L (ref 135–145)

## 2023-07-01 LAB — LACTIC ACID, PLASMA: Lactic Acid, Venous: 1.7 mmol/L (ref 0.5–1.9)

## 2023-07-01 LAB — MAGNESIUM: Magnesium: 2 mg/dL (ref 1.7–2.4)

## 2023-07-01 LAB — T4, FREE: Free T4: 1.05 ng/dL (ref 0.61–1.12)

## 2023-07-01 LAB — TSH: TSH: 4.035 u[IU]/mL (ref 0.350–4.500)

## 2023-07-01 LAB — PHOSPHORUS: Phosphorus: 4.1 mg/dL (ref 2.5–4.6)

## 2023-07-01 MED ORDER — LORAZEPAM 2 MG/ML IJ SOLN
1.0000 mg | Freq: Once | INTRAMUSCULAR | Status: AC | PRN
  Administered 2023-07-01: 1 mg via INTRAVENOUS
  Filled 2023-07-01: qty 1

## 2023-07-01 MED ORDER — GABAPENTIN 100 MG PO CAPS
100.0000 mg | ORAL_CAPSULE | Freq: Three times a day (TID) | ORAL | Status: DC | PRN
Start: 2023-07-01 — End: 2023-07-02

## 2023-07-01 MED ORDER — ENOXAPARIN SODIUM 60 MG/0.6ML IJ SOSY: 45.0000 mg | PREFILLED_SYRINGE | INTRAMUSCULAR | Status: DC

## 2023-07-01 MED ORDER — SODIUM CHLORIDE 0.9% FLUSH
3.0000 mL | Freq: Two times a day (BID) | INTRAVENOUS | Status: DC
  Administered 2023-07-01: 3 mL via INTRAVENOUS

## 2023-07-01 MED ORDER — ENOXAPARIN SODIUM 60 MG/0.6ML IJ SOSY
50.0000 mg | PREFILLED_SYRINGE | INTRAMUSCULAR | Status: DC
  Administered 2023-07-01: 50 mg via SUBCUTANEOUS
  Filled 2023-07-01: qty 0.6

## 2023-07-01 MED ORDER — GADOBUTROL 1 MMOL/ML IV SOLN
10.0000 mL | Freq: Once | INTRAVENOUS | Status: AC | PRN
  Administered 2023-07-01: 10 mL via INTRAVENOUS

## 2023-07-01 MED ORDER — LORAZEPAM 2 MG/ML IJ SOLN
1.0000 mg | Freq: Once | INTRAMUSCULAR | Status: AC
  Administered 2023-07-01: 1 mg via INTRAVENOUS
  Filled 2023-07-01: qty 1

## 2023-07-01 MED ORDER — LORAZEPAM 2 MG/ML IJ SOLN
1.0000 mg | Freq: Four times a day (QID) | INTRAMUSCULAR | Status: AC | PRN
Start: 2023-07-01 — End: 2023-07-01
  Administered 2023-07-01: 1 mg via INTRAVENOUS
  Filled 2023-07-01: qty 1

## 2023-07-01 MED ORDER — LACTATED RINGERS IV BOLUS
1000.0000 mL | Freq: Once | INTRAVENOUS | Status: AC
  Administered 2023-07-01: 1000 mL via INTRAVENOUS

## 2023-07-01 MED ORDER — VENLAFAXINE HCL ER 37.5 MG PO CP24
37.5000 mg | ORAL_CAPSULE | Freq: Every day | ORAL | Status: DC
  Filled 2023-07-01: qty 1

## 2023-07-01 NOTE — H&P (Signed)
 History and Physical    Kayla Nguyen UJW:[id number] DOB: 04/23/2000 DOA: 06/30/2023  PCP: Noreene Bearded, PA   Patient coming from: Home   Chief Complaint:  Chief Complaint  Patient presents with   Tremors   Loss of Consciousness    HPI:  Kayla Nguyen is a 23 y.o. female with limited medical history, who is transferred from [facility] ED for evaluation of possible seizure. Reportedly events occurring over the past week. Develops deep chest tightness with pain in the back as well. Symptoms lasting for minutes. Has associated palpitations, sense of skipped / hard beats. Then has shaking from her head and progressing down both arms and legs. Feels tired afterwards. Maintains awareness during these events. No incontinence. No hx of fall, or head injury. Does report recent headache, which she attributes to swollen glands along her posterior scalp, says she has radiating pain on the side of these nodes which radiate forward to her eye. No vision changes, speech changes, focal numbness or weakness. No prior neurologic d/o, no cardiac hx. Per report father had a history of epilepsy with convulsive seizure with retained awareness.   Notably mother reports that "something has been going on since February" referring to investigation for an underlying rheumatologic disorder and evaluation of lymphadenopathy, and more concerned with addition of these symptoms. Re: her symptoms that feels may be Lupus patient describes intermittent Malar rash, sun sensitivity, persistent nausea, persistent fatigue, and diffuse myalgias. Notably patient under significant stress with the recent passing of her Father in February.   Review of Systems:  ROS complete and negative except as marked above   No Known Allergies  Prior to Admission medications   Medication Sig Start Date End Date Taking? Authorizing Provider  medroxyPROGESTERone  (DEPO-PROVERA ) 150 MG/ML injection Inject 1 mL (150 mg total) into the muscle every 3  (three) months. 12/27/22   Noreene Bearded, PA  traZODone  (DESYREL ) 50 MG tablet Take 1 tablet (50 mg total) by mouth at bedtime. Patient not taking: Reported on 05/26/2023 05/15/23   Danton Dyers    Past Medical History:  Diagnosis Date   Seizure-like activity [facility]) 06/30/2023    History reviewed. No pertinent surgical history.   reports that she has never smoked. She has never been exposed to tobacco smoke. She has never used smokeless tobacco. She reports that she does not drink alcohol and does not use drugs.  Family History  Problem Relation Age of Onset   Polycythemia Mother    Diabetes Father    Prostate cancer Father    Bladder Cancer Father    Diabetes Sister    Diabetes Paternal Grandfather      Physical Exam: Vitals:   06/30/23 2300 07/01/23 0000 07/01/23 0034 07/01/23 0115  BP: 118/83 (!) 145/94  (!) 141/81  Pulse: 73 89  87  Resp: 19 16  16   Temp:   98.9 F (37.2 C) 98.5 F (36.9 C)  TempSrc:   Oral Oral  SpO2: 98% 98%  97%  Weight:      Height:        Gen: Awake, alert, NAD   CV: Regular, normal S1, S2, 1/6 SEM  Resp: Normal WOB, CTAB  Abd: Flat, normoactive, nontender MSK: Symmetric, no edema  Skin: No rashes or lesions to exposed skin  Neuro: Alert and interactive  Psych: euthymic, appropriate    Data review:   Labs reviewed, notable for:   Bicarb 20, anion gap 16 High-sensitivity troponin negative WBC 12 UA negative  for infection, UDS negative TSH within normal limit Hcg neg   Micro:  Results for orders placed or performed during the hospital encounter of 05/15/23  Resp panel by RT-PCR (RSV, Flu A&B, Covid) Anterior Nasal Swab     Status: None   Collection Time: 05/15/23 11:00 AM   Specimen: Anterior Nasal Swab  Result Value Ref Range Status   SARS Coronavirus 2 by RT PCR NEGATIVE NEGATIVE Final    Comment: (NOTE) SARS-CoV-2 target nucleic acids are NOT DETECTED.  The SARS-CoV-2 RNA is generally detectable in upper  respiratory specimens during the acute phase of infection. The lowest concentration of SARS-CoV-2 viral copies this assay can detect is 138 copies/mL. A negative result does not preclude SARS-Cov-2 infection and should not be used as the sole basis for treatment or other patient management decisions. A negative result may occur with  improper specimen collection/handling, submission of specimen other than nasopharyngeal swab, presence of viral mutation(s) within the areas targeted by this assay, and inadequate number of viral copies(<138 copies/mL). A negative result must be combined with clinical observations, patient history, and epidemiological information. The expected result is Negative.  Fact Sheet for Patients:  BloggerCourse.com  Fact Sheet for Healthcare Providers:  SeriousBroker.it  This test is no t yet approved or cleared by the [place]  FDA and  has been authorized for detection and/or diagnosis of SARS-CoV-2 by FDA under an Emergency Use Authorization (EUA). This EUA will remain  in effect (meaning this test can be used) for the duration of the COVID-19 declaration under Section 564(b)(1) of the Act, 21 U.S.C.section 360bbb-3(b)(1), unless the authorization is terminated  or revoked sooner.       Influenza A by PCR NEGATIVE NEGATIVE Final   Influenza B by PCR NEGATIVE NEGATIVE Final    Comment: (NOTE) The Xpert Xpress SARS-CoV-2/FLU/RSV plus assay is intended as an aid in the diagnosis of influenza from Nasopharyngeal swab specimens and should not be used as a sole basis for treatment. Nasal washings and aspirates are unacceptable for Xpert Xpress SARS-CoV-2/FLU/RSV testing.  Fact Sheet for Patients: BloggerCourse.com  Fact Sheet for Healthcare Providers: SeriousBroker.it  This test is not yet approved or cleared by the [place]  FDA and has been  authorized for detection and/or diagnosis of SARS-CoV-2 by FDA under an Emergency Use Authorization (EUA). This EUA will remain in effect (meaning this test can be used) for the duration of the COVID-19 declaration under Section 564(b)(1) of the Act, 21 U.S.C. section 360bbb-3(b)(1), unless the authorization is terminated or revoked.     Resp Syncytial Virus by PCR NEGATIVE NEGATIVE Final    Comment: (NOTE) Fact Sheet for Patients: BloggerCourse.com  Fact Sheet for Healthcare Providers: SeriousBroker.it  This test is not yet approved or cleared by the [place]  FDA and has been authorized for detection and/or diagnosis of SARS-CoV-2 by FDA under an Emergency Use Authorization (EUA). This EUA will remain in effect (meaning this test can be used) for the duration of the COVID-19 declaration under Section 564(b)(1) of the Act, 21 U.S.C. section 360bbb-3(b)(1), unless the authorization is terminated or revoked.  Performed at [facility], [address] [place], [place] [address]     Imaging reviewed:  CT Head Wo Contrast Result Date: 06/30/2023 CLINICAL DATA:  New onset seizure activity EXAM: CT HEAD WITHOUT CONTRAST TECHNIQUE: Contiguous axial images were obtained from the base of the skull through the vertex without intravenous contrast. RADIATION DOSE REDUCTION: This exam was performed according to the departmental dose-optimization  program which includes automated exposure control, adjustment of the mA and/or kV according to patient size and/or use of iterative reconstruction technique. COMPARISON:  None Available. FINDINGS: Brain: No evidence of acute infarction, hemorrhage, hydrocephalus, extra-axial collection or mass lesion/mass effect. Vascular: No hyperdense vessel or unexpected calcification. Skull: Normal. Negative for fracture or focal lesion. Sinuses/Orbits: No acute finding. Other: None. IMPRESSION: No acute  intracranial abnormality noted. Electronically Signed   By: Violeta Grey M.D.   On: 06/30/2023 19:54    EKG:  Personally reviewed, sinus rhythm nonpathologic inferior Q waves, no acute ischemic changes.  ED Course:  Patient was monitored at [facility] ED and noted to have 1 episode of reported seizure-like activity per family but per ED provider no tonic-clonic activity, retained awareness, no postictal period. Did have elevated HR. he was treated with Ativan .  EDP spoke with neurology Dr. Veleta Gerold reportedly recommending for admission for EEG and further evaluation.    Assessment/Plan:  23 y.o. female with hx limited medical history, who is transferred from [facility] ED for evaluation of possible seizure. 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  -Neurology consult, appreciate recommendations  -EEG, MRI brain with and without contrast.    Chest tightness Hx episodic chest tightness, nonexertional. HS trop negative. EKG nonischemic. This is unlikely to represent coronary disease given age and above factors. Suspect she may have symptomatic ectopy / palpitations causing her symptoms.  -Tele monitoring  -If persistent may consider cardiac monitor outpatient   AGMA  -Give 1 L IVF, check lactate   Leukocytosis, suspect reactive  -Trend CBC   Question somatic symptom disorder  Significant life stressor with passing of father in February, and development of broad range of symptoms since this time with limited evidence of pathology associated. Would consider somatic symptom disorder a possibility, and in any case may benefit from frequent visits with PCP and counseling  services   Chronic medical problems:  Cervical lymphadenopathy: Seen by heme / onc. No evidence of underlying malignancy / systemic disease.  IDA: Continue Iron supplements    Body mass index is 36.03 kg/m. Obesity class II, would benefit from weight loss outpatient    DVT prophylaxis:  Lovenox Code Status:  Full Code Diet:  Diet Orders (From admission, onward)     Start     Ordered   07/01/23 0210  Diet regular Room service appropriate? Yes; Fluid consistency: Thin  Diet effective now       Question Answer Comment  Room service appropriate? Yes   Fluid consistency: Thin      07/01/23 0209           Family Communication:  Yes discussed with family at bedside   Consults:  Neurology   Admission status:   Observation, Telemetry bed  Severity of Illness: The appropriate patient status for this patient is OBSERVATION. Observation status is judged to be reasonable and necessary in order to provide the required intensity of service to ensure the patient's safety. The patient's presenting symptoms, physical exam findings, and initial radiographic and laboratory data in the context of their medical condition is felt to place them at decreased risk for further clinical deterioration. Furthermore, it  is anticipated that the patient will be medically stable for discharge from the hospital within 2 midnights of admission.    Arnulfo Larch, MD [facility]  How to contact the [facility] Attending or Consulting provider 7A - 7P or covering provider during after hours 7P -7A, for this patient.  Check the care team in [facility] and look for a) attending/consulting [facility] provider listed and b) the [facility] team listed Log into [email or web address] and use [facility]'s universal password to access. If you do not have the password, please contact the hospital operator. Locate the [facility] provider you are looking for under [facility] and page to a number that you can be directly reached. If you still have  difficulty reaching the provider, please page the [facility] (Director on Call) for the Hospitalists listed on amion for assistance.  07/01/2023, 2:53 AM

## 2023-07-01 NOTE — Consult Note (Signed)
 NEUROLOGY CONSULT NOTE   Date of service: Jul 01, 2023 Patient Name: Kayla Nguyen MRN:  [id number] DOB:  [birth year] Chief Complaint: "spells" Requesting Provider: Cala Castleman, MD  History of Present Illness  Kayla Nguyen is a 23 y.o. female presenting with spells concerning for possible seizures.  She states that over the past week she has had a total of five such episodes, consisting of chest pain followed by being "zoned out" and then she feels a shaking sensation in her head and then spreads down to her body.  She remains conscious throughout the episode.  She notices that she has a right sided headache associated with each of the episodes.  She has been evaluated recently for autoimmune disease due to multiple different kinds of complaints over the past few months since February.  She has had several that have woken her from sleep.   Past History   Past Medical History:  Diagnosis Date   Seizure-like activity (HCC) 2023-07-13    History reviewed. No pertinent surgical history.  Family History: Family History  Problem Relation Age of Onset   Polycythemia Mother    Diabetes Father    Prostate cancer Father    Bladder Cancer Father    Diabetes Sister    Diabetes Paternal Grandfather     Social History  reports that she has never smoked. She has never been exposed to tobacco smoke. She has never used smokeless tobacco. She reports that she does not drink alcohol and does not use drugs.  No Known Allergies  Medications   Current Facility-Administered Medications:    enoxaparin (LOVENOX) injection 45 mg, 45 mg, Subcutaneous, Q24H, Segars, Arlyce Lambert, MD   LORazepam  (ATIVAN ) injection 1 mg, 1 mg, Intravenous, Once PRN, Segars, Arlyce Lambert, MD   sodium chloride  flush (NS) 0.9 % injection 3 mL, 3 mL, Intravenous, Q12H, Arnulfo Larch, MD  Vitals   Vitals:   07/01/23 0000 07/01/23 0034 07/01/23 0115 07/01/23 0351  BP: (!) 145/94  (!) 141/81 123/69  Pulse: 89  87 66  Resp:  16  16 (!) 22  Temp:  98.9 F (37.2 C) 98.5 F (36.9 C) 98.7 F (37.1 C)  TempSrc:  Oral Oral Oral  SpO2: 98%  97% 96%  Weight:      Height:        Body mass index is 36.03 kg/m.  Physical Exam   Constitutional: Appears well-developed and well-nourished.   Neurologic Examination    Neuro: Mental Status: Patient is awake, alert, oriented to person, place, month, year, and situation. Patient is able to give a clear and coherent history. No signs of aphasia or neglect Cranial Nerves: II: Visual Fields are full. Pupils are equal, round, and reactive to light.   III,IV, VI: EOMI without ptosis or diploplia.  V: Facial sensation is symmetric to temperature VII: Facial movement is symmetric.  VIII: hearing is intact to voice X: Uvula elevates symmetrically XII: tongue is midline without atrophy or fasciculations.  Motor: Tone is normal. Bulk is normal. 5/5 strength was present in all four extremities.  Sensory: Sensation is symmetric to light touch and temperature in the arms and legs. Cerebellar: FNF intact bilaterally        Labs/Imaging/Neurodiagnostic studies   CBC:  Recent Labs  Lab 07/13/2023 1650  WBC 12.1*  HGB 13.7  HCT 42.4  MCV 82.3  PLT 402*   Basic Metabolic Panel:  Lab Results  Component Value Date   NA 140 07/13/23   K 3.8 2023-07-13  CO2 20 (L) 06/30/2023   GLUCOSE 98 06/30/2023   BUN 15 06/30/2023   CREATININE 0.81 06/30/2023   CALCIUM 9.7 06/30/2023   GFRNONAA >60 06/30/2023   Lipid Panel:  Lab Results  Component Value Date   LDLCALC 120 (H) 02/22/2023   HgbA1c:  Lab Results  Component Value Date   HGBA1C 5.6 02/22/2023   Urine Drug Screen:     Component Value Date/Time   LABOPIA NONE DETECTED 06/30/2023 1742   COCAINSCRNUR NONE DETECTED 06/30/2023 1742   LABBENZ NONE DETECTED 06/30/2023 1742   AMPHETMU NONE DETECTED 06/30/2023 1742   THCU NONE DETECTED 06/30/2023 1742   LABBARB NONE DETECTED 06/30/2023 1742       MRI Brain(Personally reviewed): No acute findings  ASSESSMENT   Kayla Nguyen is a 23 y.o. female with recurrent episodes of chest pressure followed by "zoning out" and bilateral shaking with preserved consciousness.  I agree with Dr. Amy Kansky that PNES is my leading differential consideration.  Given the frequency with which they are happening (three in the past 24 hours), I think it may be reasonable to more definitively demonstrate this, but I would not start antiepileptic therapy unless there was definite evidence that this was seizure.  RECOMMENDATIONS  Overnight EEG with video Follow-up MRI official read Neurology will follow ______________________________________________________________________    Signed, Ann Keto, MD [facility]

## 2023-07-01 NOTE — Progress Notes (Signed)
 LTM EEG hooked up and running with CT compatible leads. [facility] monitoring. Test button tested and explained to parents in room.

## 2023-07-01 NOTE — Progress Notes (Signed)
 NEUROLOGY CONSULT FOLLOW UP NOTE   Date of service: Jul 01, 2023 Patient Name: Neila Holts MRN:  [id number] DOB:  2000/04/03  Interval Hx/subjective   Typical event captured on LTM EEG. Non epileptic.  Vitals   Vitals:   07/01/23 0000 07/01/23 0034 07/01/23 0115 07/01/23 0351  BP: (!) 145/94  (!) 141/81 123/69  Pulse: 89  87 66  Resp: 16  16 (!) 22  Temp:  98.9 F (37.2 C) 98.5 F (36.9 C) 98.7 F (37.1 C)  TempSrc:  Oral Oral Oral  SpO2: 98%  97% 96%  Weight:      Height:         Body mass index is 36.03 kg/m.  Physical Exam   General: Laying comfortably in bed; in no acute distress.  HENT: Normal oropharynx and mucosa. Normal external appearance of ears and nose.  Neck: Supple, no pain or tenderness  CV: No JVD. No peripheral edema.  Pulmonary: Symmetric Chest rise. Normal respiratory effort.  Abdomen: Soft to touch, non-tender.  Ext: No cyanosis, edema, or deformity  Skin: No rash. Normal palpation of skin.   Musculoskeletal: Normal digits and nails by inspection. No clubbing.   Neurologic Examination  Mental status/Cognition: drowsy, eyes closed, oriented to self, place, month and year, good attention.  Speech/language: Fluent, comprehension intact, object naming intact, repetition intact.  Cranial nerves:   CN II Pupils equal and reactive to light, no VF deficits    CN III,IV,VI EOM intact, no gaze preference or deviation, no nystagmus    CN V normal sensation in V1, V2, and V3 segments bilaterally    CN VII no asymmetry, no nasolabial fold flattening    CN VIII normal hearing to speech    CN IX & X normal palatal elevation, no uvular deviation    CN XI 5/5 head turn and 5/5 shoulder shrug bilaterally    CN XII midline tongue protrusion    Motor:  Muscle bulk: normal, tone normal, pronator drift none tremor none Mvmt Root Nerve  Muscle Right Left Comments  SA C5/6 Ax Deltoid 5 5   EF C5/6 Mc Biceps 5 5   EE C6/7/8 Rad Triceps 5 5   WF C6/7 Med FCR      WE C7/8 PIN ECU     F Ab C8/T1 U ADM/FDI 5 5   HF L1/2/3 Fem Illopsoas 5 5   KE L2/3/4 Fem Quad 5 5   DF L4/5 D Peron Tib Ant 5 5   PF S1/2 Tibial Grc/Sol 5 5    Sensation:  Light touch Intact throughout   Pin prick    Temperature    Vibration   Proprioception    Coordination/Complex Motor:  - Finger to Nose intact BL - Heel to shin intact BL - Rapid alternating movement are normal - Gait: deferred 2/2 somnolence.  Medications  Current Facility-Administered Medications:    enoxaparin (LOVENOX) injection 50 mg, 50 mg, Subcutaneous, Q24H, Pham, Minh Q, RPH-CPP, 50 mg at 07/01/23 0954   LORazepam  (ATIVAN ) injection 1 mg, 1 mg, Intravenous, Q6H PRN, Singh, Prashant K, MD   sodium chloride  flush (NS) 0.9 % injection 3 mL, 3 mL, Intravenous, Q12H, Segars, Jonathan, MD, 3 mL at 07/01/23 0955  Labs and Diagnostic Imaging   CBC:  Recent Labs  Lab 06/30/23 1650 07/01/23 0600  WBC 12.1* 9.2  HGB 13.7 12.4  HCT 42.4 38.0  MCV 82.3 82.3  PLT 402* 331    Basic Metabolic Panel:  Lab Results  Component Value Date   NA 139 07/01/2023   K 3.8 07/01/2023   CO2 21 (L) 07/01/2023   GLUCOSE 95 07/01/2023   BUN 13 07/01/2023   CREATININE 0.74 07/01/2023   CALCIUM 9.0 07/01/2023   GFRNONAA >60 07/01/2023   Lipid Panel:  Lab Results  Component Value Date   LDLCALC 120 (H) 02/22/2023   HgbA1c:  Lab Results  Component Value Date   HGBA1C 5.6 02/22/2023   Urine Drug Screen:     Component Value Date/Time   LABOPIA NONE DETECTED 06/30/2023 1742   COCAINSCRNUR NONE DETECTED 06/30/2023 1742   LABBENZ NONE DETECTED 06/30/2023 1742   AMPHETMU NONE DETECTED 06/30/2023 1742   THCU NONE DETECTED 06/30/2023 1742   LABBARB NONE DETECTED 06/30/2023 1742    Alcohol Level No results found for: "ETH" INR No results found for: "INR" APTT No results found for: "APTT" AED levels: No results found for: "PHENYTOIN", "ZONISAMIDE", "LAMOTRIGINE", "LEVETIRACETA"  MRI Brain(Personally  reviewed): Normal MRI of the brain.   cEEG:  Typical event captured and non epileptic.  Assessment   Kynslei Santarosa is a 23 y.o. female with recurrent episodes of chest pressure followed by "zoning out" and bilateral shaking with preserved consciousness. Typical event captured on LTM EEG and non epileptic.  Mother and sister at the bedside report patient's father passe away 2 months ago and identify this as a major stressor.  Recommendations  - discontinue LTM EEG - outpatient Psychotherapy with CBT. - we will signoff. ______________________________________________________________________   Signed, Hurschel Paynter, MD [facility]

## 2023-07-01 NOTE — Progress Notes (Signed)
 LTM VIDEO EEG discontinued - no skin breakdown at [facility]

## 2023-07-01 NOTE — Progress Notes (Signed)
    Against Medical Advice   Informed by RN that patient wants to leave AGAINST MEDICAL ADVICE.  I have spoken to the patient and she expresses her desire to leave the hospital immediately.  Patient has been warned that this is not medically advisable at this time and can result in medical complications such as disability and death. Patient is AAOx 4 and has full decision-making capacity.  She understands and accepts the risks involved and assumes full responsibilty of this decision.  Patient has also been advised that if she feels the need for further medical assistance to return to any available emergency room or dial 9-1-1 immediately.

## 2023-07-01 NOTE — Progress Notes (Signed)
 Lovenox 50mg  SQ qday for DVT px.  Ivery Marking, PharmD, BCIDP, AAHIVP, CPP Infectious Disease Pharmacist 07/01/2023 9:04 AM

## 2023-07-01 NOTE — ED Notes (Signed)
 Informed pt had another episode of seizure like activity. Pt is alert and oriented and able to follow commands.

## 2023-07-01 NOTE — Progress Notes (Signed)
 Called my EEG at 1049. Pt hit button.  Assessed pt.  Pt states "I feel confused", but answered name and place.  Pt is trembling uncontrollable all over.  Family says it starts from head to upper body all the way to legs eventually.  Administered one time order of ativan .  Notified MD.  Waiting for DR.  VS 144/99, HR 130, Resp 21.  Pt can squeeze my hands and talk to me, just a little slow to respond.  Pt states she is cold. Will continue to monitor.

## 2023-07-01 NOTE — Progress Notes (Signed)
 Pt's mother called out stating "pt having another episode".  Nurse finds pt shaking and trembling all over.  Pt can answer questions, but slow to respond.  Pupils very large, but brisk to light.  Pt unable to bend knees up during the episode of shaking.   Ativan  given.  Md Notified and at [facility].  EKG captured.  Pt feeling better, but is very tearful during the entire episode and states, "I just want it to stop".  Will continue to monitor.  Mother at [facility].
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
## 2023-07-01 NOTE — Progress Notes (Signed)
 PROGRESS NOTE                                                                                                                                                                                                             Patient Demographics:    Kayla Nguyen, is a 23 y.o. female, DOB - 2000/03/14, QIO:[id number]  Outpatient Primary MD for the patient is Noreene Bearded, PA    LOS - 0  Admit date - 06/30/2023    Chief Complaint  Patient presents with   Tremors   Loss of Consciousness       Brief Narrative (HPI from H&P)   23 y.o. female with limited medical history, who is transferred from [facility] ED for evaluation of possible seizure. Reportedly events occurring over the past week. Develops deep chest tightness with pain in the back as well. Symptoms lasting for minutes. Has associated palpitations, sense of skipped / hard beats. Then has shaking from her head and progressing down both arms and legs. Feels tired afterwards. Maintains awareness during these events. No incontinence. No hx of fall, or head injury. Does report recent headache, which she attributes to swollen glands along her posterior scalp, says she has radiating pain on the side of these nodes which radiate forward to her eye. No vision changes, speech changes, focal numbness or weakness. No prior neurologic d/o, no cardiac hx. Per report father had a history of epilepsy with convulsive seizure with retained awareness.   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  -Neurology following, overnight EEG with reported event of  episode of seizure-like activity was unremarkable, CT head and MRI brain negative.  Continue to monitor.  Psych and neurology were consulted.   Chest tightness Hx episodic chest tightness, nonexertional. HS trop negative. EKG nonischemic. This is unlikely to represent coronary disease  given age and above factors. Suspect she may have symptomatic ectopy / palpitations causing her symptoms.  -Tele monitoring  - Wondering if most of symptoms are psychological, involve psychiatry.   AGMA  -Give 1 L IVF, check lactate    Leukocytosis, suspect reactive  -Trend CBC    Question somatic symptom disorder  Significant life stressor with passing of father in February, and development of broad range of symptoms since this time with limited evidence of pathology associated. Would consider somatic symptom disorder a possibility, and in any case may benefit from frequent visits with PCP and counseling services.  Involve psychiatry.   Chronic medical problems:  Cervical lymphadenopathy: Seen by heme / onc. No evidence of underlying malignancy / systemic disease.  No lymphadenopathy on exam today. IDA: Continue Iron supplements            Condition - Fair  Family Communication  : Mother bedside on 07/01/2023  Code Status : Full code  Consults  : Neurology. psychiatry  PUD Prophylaxis :    Procedures  :     EEG.    MRI brain.  Nonacute.      Disposition Plan  :    Status is: Observation   DVT Prophylaxis  :  Lovenox    Lab Results  Component Value Date   PLT 331 07/01/2023    Diet :  Diet Order             Diet regular Room service appropriate? Yes; Fluid consistency: Thin  Diet effective now                    Inpatient Medications  Scheduled Meds:  enoxaparin (LOVENOX) injection  45 mg Subcutaneous Q24H   sodium  chloride flush  3 mL Intravenous Q12H   Continuous Infusions: PRN Meds:.LORazepam   Antibiotics  :    Anti-infectives (From admission, onward)    None       Objective:   Vitals:   07/01/23 0000 07/01/23 0034 07/01/23 0115 07/01/23 0351  BP: (!) 145/94  (!) 141/81 123/69  Pulse: 89  87 66  Resp: 16  16 (!) 22  Temp:  98.9 F (37.2 C) 98.5 F (36.9 C) 98.7 F (37.1 C)  TempSrc:  Oral Oral Oral  SpO2: 98%  97% 96%  Weight:      Height:        Wt Readings from Last 3 Encounters:  06/30/23 98.2 kg  06/24/23 98.2 kg  05/26/23 95.2 kg    No intake or output data in the 24 hours ending 07/01/23 0855   Physical Exam  Awake Alert, No new F.N deficits, flat affect [place].AT,PERRAL Supple Neck, No JVD,   Symmetrical Chest wall movement, Good air movement bilaterally, CTAB RRR,No Gallops,Rubs or new Murmurs,  +ve B.Sounds, Abd Soft, No tenderness,   No Cyanosis, Clubbing or edema      Data Review:    Recent Labs  Lab 06/30/23 1650 07/01/23 0600  WBC 12.1* 9.2  HGB 13.7 12.4  HCT 42.4 38.0  PLT 402* 331  MCV 82.3 82.3  MCH 26.6 26.8  MCHC 32.3 32.6  RDW 14.3 14.4    Recent Labs  Lab 06/30/23 1650 06/30/23 1714 07/01/23 0600  NA 140  --  139  K 3.8  --  3.8  CL 104  --  108  CO2 20*  --  21*  ANIONGAP 16*  --  10  GLUCOSE 98  --  95  BUN  15  --  13  CREATININE 0.81  --  0.74  AST 16  --   --   ALT 18  --   --   ALKPHOS 84  --   --   BILITOT <0.2  --   --   ALBUMIN 4.8  --   --   LATICACIDVEN  --   --  1.7  TSH  --  3.610  --   MG  --   --  2.0  PHOS  --   --  4.1  CALCIUM 9.7  --  9.0      Recent Labs  Lab 06/30/23 1650 06/30/23 1714 07/01/23 0600  LATICACIDVEN  --   --  1.7  TSH  --  3.610  --   MG  --   --  2.0  CALCIUM 9.7  --  9.0    --------------------------------------------------------------------------------------------------------------- Lab Results  Component Value Date   CHOL 184 02/22/2023   HDL 45 02/22/2023    LDLCALC 120 (H) 02/22/2023   TRIG 104 02/22/2023   CHOLHDL 4.1 02/22/2023    Lab Results  Component Value Date   HGBA1C 5.6 02/22/2023   Recent Labs    06/30/23 1714  TSH 3.610   No results for input(s): "VITAMINB12", "FOLATE", "FERRITIN", "TIBC", "IRON", "RETICCTPCT" in the last 72 hours. ------------------------------------------------------------------------------------------------------------------ Cardiac Enzymes No results for input(s): "CKMB", "TROPONINI", "MYOGLOBIN" in the last 168 hours.  Invalid input(s): "CK"  Micro Results No results found for this or any previous visit (from the past 240 hours).  Radiology Report MR BRAIN W WO CONTRAST Result Date: 07/01/2023 CLINICAL DATA:  Possible seizure, evaluate for mass. EXAM: MRI HEAD WITHOUT AND WITH CONTRAST TECHNIQUE: Multiplanar, multiecho pulse sequences of the brain and surrounding structures were obtained without and with intravenous contrast. CONTRAST:  10mL GADAVIST GADOBUTROL 1 MMOL/ML IV SOLN COMPARISON:  Head CT from yesterday FINDINGS: Brain: No infarction, hemorrhage, hydrocephalus, extra-axial collection or mass lesion. Normal brain volume and myelination. No abnormal enhancement. Vascular: Normal flow voids. Skull and upper cervical spine: Normal marrow signal. Sinuses/Orbits: Negative. IMPRESSION: Normal MRI of the brain. Electronically Signed   By: Ronnette Coke M.D.   On: 07/01/2023 07:08   CT Head Wo Contrast Result Date: 06/30/2023 CLINICAL DATA:  New onset seizure activity EXAM: CT HEAD WITHOUT CONTRAST TECHNIQUE: Contiguous axial images were obtained from the base of the skull through the vertex without intravenous contrast. RADIATION DOSE REDUCTION: This exam was performed according to the departmental dose-optimization program which includes automated exposure control, adjustment of the mA and/or kV according to patient size and/or use of iterative reconstruction technique. COMPARISON:  None Available.  FINDINGS: Brain: No evidence of acute infarction, hemorrhage, hydrocephalus, extra-axial collection or mass lesion/mass effect. Vascular: No hyperdense vessel or unexpected calcification. Skull: Normal. Negative for fracture or focal lesion. Sinuses/Orbits: No acute finding. Other: None. IMPRESSION: No acute intracranial abnormality noted. Electronically Signed   By: Violeta Grey M.D.   On: 06/30/2023 19:54     Signature  -   Lynnwood Sauer M.D on 07/01/2023 at 8:55 AM   -  To page go to [email or web address]

## 2023-07-01 NOTE — Progress Notes (Signed)
 Pt stating she would like to get discharged. RN paged and updated Dr Michell Ahumada. Dr Michell Ahumada spoke to the patient and family. Patient is A&Ox4 and insistant on leaving AMA instead of waiting until the morning for discharge. AMA form signed and place in chart. IV was removed and patient has left the unit.

## 2023-07-01 NOTE — Discharge Instructions (Signed)
 Therapist: Vito Grippe [facility] [address] [place] [address] [place], [place] [address] [phone]

## 2023-07-01 NOTE — Progress Notes (Signed)
 0100- Pt arrived to unit via EMS   A&Ox4  RA  NAD Connected to telemetry. Oriented to room, call light, and instructed to call staff if needing assistance out of bed. Pt acknowledged instructions.

## 2023-07-01 NOTE — Progress Notes (Signed)
   07/01/23 1638  TOC Brief Assessment  Insurance and Status Reviewed [facility])  Patient has primary care physician Yes (Available  Noreene Bearded, PA)  Home environment has been reviewed From Home  Prior level of function: Independent  Prior/Current Home Services No current home services  Social Drivers of Health Review SDOH reviewed no interventions necessary  Readmission risk has been reviewed Yes (9%)  Transition of care needs no transition of care needs at this time   No TOC needs identified at this time. Please place a TOC consult should a need arise

## 2023-07-01 NOTE — Procedures (Signed)
 Patient Name: Kayla Nguyen  MRN: [id number]  Epilepsy Attending: Arleene Lack  Referring Physician/Provider: Augustin Leber, MD  Duration: 07/01/2023 [phone] to [phone]  Patient history: 23 year old female with seizure-like episodes getting EEG for characterization of spells and to evaluate for seizures.  Level of alertness: Awake, asleep  AEDs during EEG study: None  Technical aspects: This EEG study was done with scalp electrodes positioned according to the 10-20 International system of electrode placement. Electrical activity was reviewed with band pass filter of 1-70Hz , sensitivity of 7 uV/mm, display speed of 87mm/sec with a 60Hz  notched filter applied as appropriate. EEG data were recorded continuously and digitally stored.  Video monitoring was available and reviewed as appropriate.  Description: The posterior dominant rhythm consists of 8-9 Hz activity of moderate voltage (25-35 uV) seen predominantly in posterior head regions, symmetric and reactive to eye opening and eye closing. Sleep was characterized by vertex waves, sleep spindles (12 to 14 Hz), maximal frontocentral region. Hyperventilation and photic stimulation were not performed.     One event was recorded on 07/01/2023 at 1048.  Patient reported feeling confused and was noted to have whole body trembling.  Concomitant EEG before, during and after the event did not show any EEG change to suggest seizure.  IMPRESSION: This study is within normal limits. No seizures or epileptiform discharges were seen throughout the recording.  One event was recorded on 07/01/2023 at 1048.  Patient reported feeling confused and was noted to have whole body trembling.  Concomitant EEG did not show any EEG change.  This was a nonepileptic event.  Salinda Snedeker O Verneda Hollopeter

## 2023-07-02 ENCOUNTER — Encounter (HOSPITAL_COMMUNITY)

## 2023-07-02 NOTE — Discharge Summary (Addendum)
 AMA  Patient at night expressed her desire to leave the Hospital immidiately, patient was been warned that this is not Medically advisable at this time, and can result in Medical complications by the night MD. She chose to sign AMA papers and leave.  Note both parents were in the room according to the nursing staff.   Lynnwood Sauer M.D on 07/02/2023 at 5:26 AM  [facility]  Time < 30 minutes  Last Note Below                                                                      PROGRESS NOTE                                                                                                                                                                                                             Patient Demographics:    Kayla Nguyen, is a 23 y.o. female, DOB - [birth year], ZOX:[id number]  Outpatient Primary MD for the patient is Noreene Bearded, PA    LOS - 1  Admit date - 06/30/2023    Chief Complaint  Patient presents with   Tremors   Loss of Consciousness       Brief Narrative (HPI from H&P)   23 y.o. female with limited medical history, who is transferred from [facility] ED for evaluation of possible seizure. Reportedly events occurring over the past week. Develops deep chest tightness with pain in the back as well. Symptoms lasting for minutes. Has associated palpitations, sense of skipped / hard beats. Then has shaking from her head and progressing down both arms and legs. Feels tired afterwards. Maintains awareness during these events. No incontinence. No hx of fall, or head injury. Does report recent headache, which she attributes to swollen glands along her posterior scalp, says she has radiating pain on the side of these nodes which radiate forward to her eye. No vision changes, speech changes, focal numbness or weakness. No prior  neurologic d/o, no cardiac hx. Per report father had a history of epilepsy with convulsive seizure with retained awareness.   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  -Neurology following, overnight EEG with reported event of  episode of seizure-like activity was unremarkable, CT head and MRI brain negative.  Continue to monitor.  Psych and neurology were consulted.   Chest tightness Hx episodic chest tightness, nonexertional. HS trop negative. EKG nonischemic. This is unlikely to represent coronary disease given age and above factors. Suspect she may have symptomatic ectopy / palpitations causing her symptoms.  -Tele monitoring  - Wondering if most of symptoms are psychological, involve psychiatry.   AGMA  -Give 1 L IVF, check lactate    Leukocytosis, suspect reactive  -Trend CBC    Question somatic symptom disorder  Significant life stressor with passing of father in February, and development of broad range of symptoms since this time with limited evidence of pathology associated. Would consider somatic symptom disorder a possibility, and in any case may benefit from frequent visits with PCP and counseling services.  Involve psychiatry.   Chronic medical problems:  Cervical lymphadenopathy: Seen by heme / onc. No evidence of underlying malignancy / systemic disease.  No lymphadenopathy on exam today. IDA: Continue Iron supplements            Condition - Fair  Family Communication  : Mother  bedside on 07/01/2023  Code Status : Full code  Consults  : Neurology. psychiatry  PUD Prophylaxis :    Procedures  :     EEG.    MRI brain.  Nonacute.      Disposition Plan  :    Status is: Observation   DVT Prophylaxis  :  Lovenox    Lab Results  Component Value Date   PLT 331 07/01/2023    Diet :  Diet Order     None        Inpatient Medications  Scheduled Meds:  medroxyPROGESTERone   150 mg Intramuscular Q90 days   Continuous Infusions: PRN Meds:.  Antibiotics  :    Anti-infectives (From admission, onward)    None       Objective:   Vitals:   07/01/23 0034 07/01/23 0115 07/01/23 0351 07/01/23 2031  BP:  (!) 141/81 123/69 (!) 145/85  Pulse:  87 66   Resp:  16 (!) 22   Temp: 98.9 F (37.2 C) 98.5 F (36.9 C) 98.7 F (37.1 C) 98.2 F (36.8 C)  TempSrc: Oral Oral Oral Oral  SpO2:  97% 96%   Weight:      Height:        Wt Readings from Last 3 Encounters:  06/30/23 98.2 kg  06/24/23 98.2 kg  05/26/23 95.2 kg    No intake or output data in the 24 hours ending 07/02/23 0526   Physical Exam  Awake Alert, No new F.N deficits, flat affect [place].AT,PERRAL Supple Neck, No JVD,   Symmetrical Chest wall movement, Good air  movement bilaterally, CTAB RRR,No Gallops,Rubs or new Murmurs,  +ve B.Sounds, Abd Soft, No tenderness,   No Cyanosis, Clubbing or edema      Data Review:    Recent Labs  Lab 06/30/23 1650 07/01/23 0600  WBC 12.1* 9.2  HGB 13.7 12.4  HCT 42.4 38.0  PLT 402* 331  MCV 82.3 82.3  MCH 26.6 26.8  MCHC 32.3 32.6  RDW 14.3 14.4    Recent Labs  Lab 06/30/23 1650 06/30/23 1714 07/01/23 0600 07/01/23 1230  NA 140  --  139  --   K 3.8  --  3.8  --   CL 104  --  108  --   CO2 20*  --  21*  --   ANIONGAP 16*  --  10  --   GLUCOSE 98  --  95  --   BUN 15  --  13  --   CREATININE 0.81  --  0.74  --   AST 16  --   --   --   ALT 18  --   --   --   ALKPHOS 84  --   --   --   BILITOT <0.2  --   --   --   ALBUMIN  4.8  --   --   --   LATICACIDVEN  --   --  1.7  --   TSH  --  3.610  --  4.035  MG  --   --  2.0  --   PHOS  --   --  4.1  --   CALCIUM 9.7  --  9.0  --       Recent Labs  Lab 06/30/23 1650 06/30/23 1714 07/01/23 0600 07/01/23 1230  LATICACIDVEN  --   --  1.7  --   TSH  --  3.610  --  4.035  MG  --   --  2.0  --   CALCIUM 9.7  --  9.0  --     --------------------------------------------------------------------------------------------------------------- Lab Results  Component Value Date   CHOL 184 02/22/2023   HDL 45 02/22/2023   LDLCALC 120 (H) 02/22/2023   TRIG 104 02/22/2023   CHOLHDL 4.1 02/22/2023    Lab Results  Component Value Date   HGBA1C 5.6 02/22/2023   Recent Labs    07/01/23 1206 07/01/23 1230  TSH  --  4.035  FREET4 1.05  --    No results for input(s): "VITAMINB12", "FOLATE", "FERRITIN", "TIBC", "IRON", "RETICCTPCT" in the last 72 hours. ------------------------------------------------------------------------------------------------------------------ Cardiac Enzymes No results for input(s): "CKMB", "TROPONINI", "MYOGLOBIN" in the last 168 hours.  Invalid input(s): "CK"  Micro Results No results found for this or any previous visit (from the past 240 hours).  Radiology Report Overnight EEG with video Result Date: 07/01/2023 Arleene Lack, MD     07/01/2023  3:17 PM Patient Name: Fawna Tangonan MRN: [id number] Epilepsy Attending: Arleene Lack Referring Physician/Provider: Augustin Leber, MD Duration: 07/01/2023 [phone] to 1329 Patient history: 23 year old female with seizure-like episodes getting EEG for characterization of spells and to evaluate for seizures. Level of alertness: Awake, asleep AEDs during EEG study: None Technical aspects: This EEG study was done with scalp electrodes positioned according to the 10-20 International system of electrode placement. Electrical activity was reviewed with band pass filter of 1-70Hz , sensitivity of 7  uV/mm, display speed of 68mm/sec with a 60Hz  notched filter applied as appropriate. EEG data were recorded continuously and digitally stored.  Video  monitoring was available and reviewed as appropriate. Description: The posterior dominant rhythm consists of 8-9 Hz activity of moderate voltage (25-35 uV) seen predominantly in posterior head regions, symmetric and reactive to eye opening and eye closing. Sleep was characterized by vertex waves, sleep spindles (12 to 14 Hz), maximal frontocentral region. Hyperventilation and photic stimulation were not performed.   One event was recorded on 07/01/2023 at 1048.  Patient reported feeling confused and was noted to have whole body trembling.  Concomitant EEG before, during and after the event did not show any EEG change to suggest seizure. IMPRESSION: This study is within normal limits. No seizures or epileptiform discharges were seen throughout the recording. One event was recorded on 07/01/2023 at 1048.  Patient reported feeling confused and was noted to have whole body trembling.  Concomitant EEG did not show any EEG change.  This was a nonepileptic event. Priyanka O Yadav   MR BRAIN W WO CONTRAST Result Date: 07/01/2023 CLINICAL DATA:  Possible seizure, evaluate for mass. EXAM: MRI HEAD WITHOUT AND WITH CONTRAST TECHNIQUE: Multiplanar, multiecho pulse sequences of the brain and surrounding structures were obtained without and with intravenous contrast. CONTRAST:  10mL GADAVIST GADOBUTROL 1 MMOL/ML IV SOLN COMPARISON:  Head CT from yesterday FINDINGS: Brain: No infarction, hemorrhage, hydrocephalus, extra-axial collection or mass lesion. Normal brain volume and myelination. No abnormal enhancement. Vascular: Normal flow voids. Skull and upper cervical spine: Normal marrow signal. Sinuses/Orbits: Negative. IMPRESSION: Normal MRI of the brain. Electronically Signed   By: Ronnette Coke M.D.   On: 07/01/2023 07:08   CT Head Wo Contrast Result Date: 06/30/2023 CLINICAL  DATA:  New onset seizure activity EXAM: CT HEAD WITHOUT CONTRAST TECHNIQUE: Contiguous axial images were obtained from the base of the skull through the vertex without intravenous contrast. RADIATION DOSE REDUCTION: This exam was performed according to the departmental dose-optimization program which includes automated exposure control, adjustment of the mA and/or kV according to patient size and/or use of iterative reconstruction technique. COMPARISON:  None Available. FINDINGS: Brain: No evidence of acute infarction, hemorrhage, hydrocephalus, extra-axial collection or mass lesion/mass effect. Vascular: No hyperdense vessel or unexpected calcification. Skull: Normal. Negative for fracture or focal lesion. Sinuses/Orbits: No acute finding. Other: None. IMPRESSION: No acute intracranial abnormality noted. Electronically Signed   By: Violeta Grey M.D.   On: 06/30/2023 19:54     Signature  -   Lynnwood Sauer M.D on 07/02/2023 at 5:26 AM   -  To page go to [email or web address]

## 2023-07-04 DIAGNOSIS — M9915 Subluxation complex (vertebral) of pelvic region: Secondary | ICD-10-CM | POA: Diagnosis not present

## 2023-07-04 DIAGNOSIS — M9914 Subluxation complex (vertebral) of sacral region: Secondary | ICD-10-CM | POA: Diagnosis not present

## 2023-07-04 DIAGNOSIS — Z419 Encounter for procedure for purposes other than remedying health state, unspecified: Secondary | ICD-10-CM | POA: Diagnosis not present

## 2023-07-04 DIAGNOSIS — M9913 Subluxation complex (vertebral) of lumbar region: Secondary | ICD-10-CM | POA: Diagnosis not present

## 2023-07-05 ENCOUNTER — Ambulatory Visit: Payer: Self-pay

## 2023-07-05 NOTE — Telephone Encounter (Signed)
 Chief Complaint: Seizure like symptoms Symptoms: see notes Frequency: since Thursday Pertinent Negatives: Patient denies diagnosis of seizures Disposition: [] ED /[] Urgent Care (no appt availability in office) / [x] Appointment(In office/virtual)/ []  [facility]/ [] Home Care/ [] Refused Recommended Disposition /[] [facility]/ [x]  Follow-up with PCP Additional Notes: Patient called in stating she was looking to schedule sooner appt. Patient has been experiencing symptoms since Thursday that states it happens around 10 each morning. Patient has episodes that start with increase in HR, and results in increased BP, nausea, hot flash, severe pain between shoulder blades in her back, dizziness. Patient states these symptoms are similar to what her father used to experience with his epilepsy. Patient denies diagnosis of epilepsy. Patient denies daily medications, daily routine that would cause this reaction. Patient's mother has given her 0.5 mg of Xanax and patient states this helps prevent the symptoms from progressing. Patient went to [facility] on Thursday and was sent to the hospital for admission. Patient states they performed EEG and EKG on patient but all results came back normal. Patient reports she did have an episode while hooked up to EEG/EKG. However; patient states her father's epilepsy was diagnosed by a cardiologist, and he was cleared by neurology. Therefore; patient wants further follow up on symptoms. Please contact patient asap if patient can be moved to an earlier appt slot.   Copied from CRM [id number]. Topic: Clinical - Medical Advice >> Jul 05, 2023  2:40 PM Ja-Kwan M wrote: Reason for CRM: Patient has an appointment on 07/25/23 but reports that she has been experiencing seizure like symptoms and would like a sooner appt. Patient stated she is not currently having any symptoms but noticed increased heart rate (130-140), blood pressure (range 140-150) and shallow breathing. Patient  stated that this usually occurs around 10- 10:30 am and it last 10-15 minutes but she has experienced it lasting up to 1 hour. Call back# [phone] Reason for Disposition  Nursing judgment or information in reference    See notes  Answer Assessment - Initial Assessment Questions 1. SYMPTOM: "What is the main symptom you are concerned about?" (e.g., weakness, numbness)     See notes 2. ONSET: "When did this start?" (minutes, hours, days; while sleeping)     Thursday 3. LAST NORMAL: "When was the last time you (the patient) were normal (no symptoms)?"     See notes 4. PATTERN "Does this come and go, or has it been constant since it started?"  "Is it present now?"     Comes and goes 5. CARDIAC SYMPTOMS: "Have you had any of the following symptoms: chest pain, difficulty breathing, palpitations?"     Increase in HR, Increase in BP, chest pressure 6. NEUROLOGIC SYMPTOMS: "Have you had any of the following symptoms: headache, dizziness, vision loss, double vision, changes in speech, unsteady on your feet?"     Nausea, severe pain in middle of shoulder bladers  Answer Assessment - Initial Assessment Questions 1. REASON FOR CALL: "What is your main concern right now?"     Patient looking to move up appt to a sooner date for evaluation with PCP 2. ONSET: "When did the symptoms start?"     Thursday 3. SEVERITY: "How bad is the symptoms?"     Patient has daily episodes that occur around 10 am - patient was evaluated in the ER 4. FEVER: "Do you have a fever?"     N/a 5. OTHER SYMPTOMS: "Do you have any other new symptoms?"  See notes 6. TREATMENTS AND RESPONSE: "What have you done so far to try to make this better? What medicines have you used?"     Patient seen and evaluated in ED. Patient has tried mother's 0.5 mg Xanax that has helped mildly.  Protocols used: Neurologic Deficit-A-AH, No Guideline Available-A-AH

## 2023-07-07 DIAGNOSIS — M9913 Subluxation complex (vertebral) of lumbar region: Secondary | ICD-10-CM | POA: Diagnosis not present

## 2023-07-07 DIAGNOSIS — M9915 Subluxation complex (vertebral) of pelvic region: Secondary | ICD-10-CM | POA: Diagnosis not present

## 2023-07-07 DIAGNOSIS — M9914 Subluxation complex (vertebral) of sacral region: Secondary | ICD-10-CM | POA: Diagnosis not present

## 2023-07-11 DIAGNOSIS — M9915 Subluxation complex (vertebral) of pelvic region: Secondary | ICD-10-CM | POA: Diagnosis not present

## 2023-07-11 DIAGNOSIS — M9913 Subluxation complex (vertebral) of lumbar region: Secondary | ICD-10-CM | POA: Diagnosis not present

## 2023-07-11 DIAGNOSIS — M9914 Subluxation complex (vertebral) of sacral region: Secondary | ICD-10-CM | POA: Diagnosis not present

## 2023-07-14 ENCOUNTER — Encounter: Payer: Self-pay | Admitting: Family Medicine

## 2023-07-14 ENCOUNTER — Telehealth (INDEPENDENT_AMBULATORY_CARE_PROVIDER_SITE_OTHER): Payer: Self-pay | Admitting: Family Medicine

## 2023-07-14 VITALS — Ht 65.0 in

## 2023-07-14 DIAGNOSIS — R000 Tachycardia, unspecified: Secondary | ICD-10-CM

## 2023-07-14 DIAGNOSIS — Z3009 Encounter for other general counseling and advice on contraception: Secondary | ICD-10-CM | POA: Diagnosis not present

## 2023-07-14 DIAGNOSIS — R569 Unspecified convulsions: Secondary | ICD-10-CM

## 2023-07-14 NOTE — Progress Notes (Unsigned)
   Acute Office Visit  Subjective:     Patient ID: Kayla Nguyen, female    DOB: April 29, 2000, 23 y.o.   MRN: [id number]  Chief Complaint  Patient presents with   Seizures    HPI Patient location: Home Provider location: [facility] Method of visit: Video Duration: 20 minutes.  Patient is in today for follow-up of recent neurologic complaints including seizure-like activity, memory concerns, confusion, episodic neuropathy.  She was admitted to the hospital earlier this month for the seizure like activity.  Workup was largely negative.  MRI was normal.  EEG was normal.  Patient felt like she was told she was "faking it for attention" and she left AMA afterwards.  Never had follow-up with neurology scheduled due to leaving AMA.  Patient states these episodes can last from 15 minutes to an hour and generally occur every day but can vary from multiple times a day 2 to 3 days without having an episode.  States she does not go fully unresponsive in the that she is aware of her surroundings but unable to respond.  She also complains of episodes of heart pounding, hypertension, lightheadedness.  Symptoms are generally worse with a hot environment.  Also complains of hair loss and nausea.  Before her workup in the hospital she had workup for tickborne disease due to some lymphadenopathy.  Lymphadenopathy was present on her CT head and neck.  Patient also would like to get a Nexplanon.  She has no gynecologist.  She is okay with referral to gynecologist.  Prefers female provider.     ROS      Objective:    Ht 5\' 5"  (1.651 m)   BMI 36.03 kg/m    Physical Exam General: Alert, oriented. Pulmonary: No respiratory distress Psych: Pleasant affect. Neuro: Unable to examine due to telemedicine visit.  No results found for any visits on 07/14/23.      Assessment & Plan:   Seizure-like activity [facility]) Assessment & Plan: Extensive blood testing, EEG, MRI of the brain have all been  unremarkable.  Patient sees behavioral health already. Pt's describes generalized seizures but without loss of consciousness (aware of surroundings but states she is unable to communicate).  Symptoms do no correlate to stroke, tia, or other typical neurologic disorders.  No evidence of MS or other demyelinating disorder on MRI brain.  Consider complex migraines, but symptoms she has described seem atypical even for complex migraine.  has had evaluation of similar symptoms within the past few months with oncology and workup was largely normal with the exception of low ferritin.Will send in referral to neurology.  Will test metanephrines given her complaints of htn and tachycardia  Orders: -     Ambulatory referral to Neurology  Tachycardia -     Metanephrines, plasma; Future  Birth control counseling -     Ambulatory referral to Gynecology     No follow-ups on file.  Laneta Pintos, MD

## 2023-07-14 NOTE — Assessment & Plan Note (Signed)
 Extensive blood testing, EEG, MRI of the brain have all been unremarkable.  Patient sees behavioral health already.

## 2023-07-25 ENCOUNTER — Ambulatory Visit: Admitting: Family Medicine

## 2023-08-02 ENCOUNTER — Ambulatory Visit (INDEPENDENT_AMBULATORY_CARE_PROVIDER_SITE_OTHER): Admitting: Licensed Clinical Social Worker

## 2023-08-02 DIAGNOSIS — F4321 Adjustment disorder with depressed mood: Secondary | ICD-10-CM

## 2023-08-04 ENCOUNTER — Other Ambulatory Visit: Payer: Self-pay

## 2023-08-04 ENCOUNTER — Emergency Department (HOSPITAL_COMMUNITY)
Admission: EM | Admit: 2023-08-04 | Discharge: 2023-08-04 | Disposition: A | Discharge status: Home / Self Care | Attending: Emergency Medicine | Admitting: Emergency Medicine

## 2023-08-04 ENCOUNTER — Encounter (HOSPITAL_COMMUNITY): Payer: Self-pay | Admitting: Emergency Medicine

## 2023-08-04 DIAGNOSIS — Z743 Need for continuous supervision: Secondary | ICD-10-CM | POA: Diagnosis not present

## 2023-08-04 DIAGNOSIS — R258 Other abnormal involuntary movements: Secondary | ICD-10-CM | POA: Insufficient documentation

## 2023-08-04 DIAGNOSIS — R569 Unspecified convulsions: Secondary | ICD-10-CM | POA: Diagnosis not present

## 2023-08-04 DIAGNOSIS — Z419 Encounter for procedure for purposes other than remedying health state, unspecified: Secondary | ICD-10-CM | POA: Diagnosis not present

## 2023-08-04 LAB — BASIC METABOLIC PANEL WITH GFR
Anion gap: 9 (ref 5–15)
BUN: 9 mg/dL (ref 6–20)
CO2: 24 mmol/L (ref 22–32)
Calcium: 9.1 mg/dL (ref 8.9–10.3)
Chloride: 105 mmol/L (ref 98–111)
Creatinine, Ser: 0.74 mg/dL (ref 0.44–1.00)
GFR, Estimated: >60 mL/min (ref >60)
Glucose, Bld: 98 mg/dL (ref 70–99)
Potassium: 3.7 mmol/L (ref 3.5–5.1)
Sodium: 138 mmol/L (ref 135–145)

## 2023-08-04 LAB — CBC WITH DIFFERENTIAL/PLATELET
Abs Immature Granulocytes: 0.01 10*3/uL (ref 0.00–0.07)
Basophils Absolute: 0.1 10*3/uL (ref 0.0–0.1)
Basophils Relative: 1 %
Eosinophils Absolute: 0.1 10*3/uL (ref 0.0–0.5)
Eosinophils Relative: 1 %
HCT: 40.6 % (ref 36.0–46.0)
Hemoglobin: 12.9 g/dL (ref 12.0–15.0)
Immature Granulocytes: 0 %
Lymphocytes Relative: 33 %
Lymphs Abs: 2.3 10*3/uL (ref 0.7–4.0)
MCH: 26.3 pg (ref 26.0–34.0)
MCHC: 31.8 g/dL (ref 30.0–36.0)
MCV: 82.9 fL (ref 80.0–100.0)
Monocytes Absolute: 0.5 10*3/uL (ref 0.1–1.0)
Monocytes Relative: 7 %
Neutro Abs: 4.1 10*3/uL (ref 1.7–7.7)
Neutrophils Relative %: 58 %
Platelets: 333 10*3/uL (ref 150–400)
RBC: 4.9 MIL/uL (ref 3.87–5.11)
RDW: 13.6 % (ref 11.5–15.5)
WBC: 6.9 10*3/uL (ref 4.0–10.5)
nRBC: 0 % (ref 0.0–0.2)

## 2023-08-04 LAB — HCG, SERUM, QUALITATIVE: Preg, Serum: NEGATIVE

## 2023-08-04 NOTE — ED Triage Notes (Signed)
 BIB EMS from work,    C/c seizure like activity, pt reports tremor in left arm. Remained Aox4, GCS 15 during episodes,  EMS provided 5mg  of midazolam IM. Pt adv. She had an episode like this yesterday also, same description. Pt reports seeing HCP reference this issue and takes xanax for this issue.    18LAC   EMS v/s   132/68 HR 100 SPO2 97% RA RR 14 BGL 115

## 2023-08-04 NOTE — ED Provider Notes (Signed)
 [place] EMERGENCY DEPARTMENT AT [facility] Provider Note  CSN: [id number] Arrival date & time: 08/04/23 1359  Chief Complaint(s) No chief complaint on file.  HPI Kayla Nguyen is a 23 y.o. female who is here today because of concern of seizure-like activity.  Patient reports over the last 1 month she has been feeling unwell.  She reports feeling fatigued, was concerned that she had lupus, but now believes she might have POTS.  She was admitted earlier in May, had workup for seizure-like activity that did not show any evidence of epileptiform activity on EEG.  Patient left AMA prior to completion of her evaluation.  She has followed up with her PCP and has outpatient follow-up with neurology at the end of July.  Patient reports feeling unwell at work.  Felt like her arm was shaking.   Past Medical History Past Medical History:  Diagnosis Date   Seizure-like activity (HCC) 06/30/2023   Patient Active Problem List   Diagnosis Date Noted   Chest tightness 07/01/2023   Generalized anxiety disorder 07/01/2023   Major depressive disorder, single episode, moderate (HCC) 07/01/2023   Seizure-like activity (HCC) 06/30/2023   Lymphadenopathy of head and neck 06/01/2023   Lower abdominal pain 06/01/2023   Pain of both wrist joints 06/01/2023   Other fatigue 06/01/2023   Vaginal bleeding 06/01/2023   Butterfly rash 06/01/2023   Irregular periods 03/01/2023   Vitamin D  deficiency 03/01/2023   Home Medication(s) Prior to Admission medications   Medication Sig Start Date End Date Taking? Authorizing Provider  medroxyPROGESTERone  (DEPO-PROVERA ) 150 MG/ML injection Inject 1 mL (150 mg total) into the muscle every 3 (three) months. 12/27/22   Noreene Bearded, PA                                                                                                                                    Past Surgical History History reviewed. No pertinent surgical history. Family  History Family History  Problem Relation Age of Onset   Polycythemia Mother    Diabetes Father    Prostate cancer Father    Bladder Cancer Father    Diabetes Sister    Diabetes Paternal Grandfather     Social History Social History   Tobacco Use   Smoking status: Never    Passive exposure: Never   Smokeless tobacco: Never  Vaping Use   Vaping status: Never Used  Substance Use Topics   Alcohol use: No   Drug use: No   Allergies Patient has no known allergies.  Review of Systems Review of Systems  Physical Exam Vital Signs  I have reviewed the triage vital signs BP 107/70   Pulse 83   Temp 98.6 F (37 C) (Oral)   Resp 16   SpO2 98%   Physical Exam Vitals and nursing note reviewed.  Constitutional:      Appearance: Normal appearance.   Eyes:  Pupils: Pupils are equal, round, and reactive to light.    Cardiovascular:     Rate and Rhythm: Normal rate and regular rhythm.  Pulmonary:     Effort: Pulmonary effort is normal.   Skin:    General: Skin is warm.   Neurological:     General: No focal deficit present.     Mental Status: She is alert.     Cranial Nerves: No cranial nerve deficit.     Motor: No weakness.     Gait: Gait normal.     ED Results and Treatments Labs (all labs ordered are listed, but only abnormal results are displayed) Labs Reviewed  BASIC METABOLIC PANEL WITH GFR  CBC WITH DIFFERENTIAL/PLATELET  HCG, SERUM, QUALITATIVE                                                                                                                          Radiology No results found.  Pertinent labs & imaging results that were available during my care of the patient were reviewed by me and considered in my medical decision making (see MDM for details).  Medications Ordered in ED Medications - No data to display                                                                                                                                    Procedures Procedures  (including critical care time)  Medical Decision Making / ED Course   This patient presents to the ED for concern of arm tremor, this involves an extensive number of treatment options, and is a complaint that carries with it a high risk of complications and morbidity.  The differential diagnosis includes nonepileptic seizure, baseline tremor, electrolyte abnormalities, dehydration, less likely syncope.  MDM: Patient overall looks well.  She has normal vital signs.  Normal heart sounds, sinus rhythm.  Reviewed the patient's hospitalist note.  Lower likelihood of seizure-like activity.  This currently being worked up on an outpatient basis.  Discussed this with the patient, explained how we likely would not have an answer for her symptoms given the very thorough workup she has already received.  Patient was comfortable with this, says that she was more implored by work to come to get evaluated.  Will check basic labs on the patient.  Will plan to have her follow-up with her PCP.  Reassessment 5:15 PM-patient with no leukocytosis,  no anemia, normal renal function.  Negative pregnancy.  Will discharge.   Additional history obtained:  -External records from outside source obtained and reviewed including: Chart review including previous notes, labs, imaging, consultation notes   Lab Tests: -I ordered, reviewed, and interpreted labs.   The pertinent results include:   Labs Reviewed  BASIC METABOLIC PANEL WITH GFR  CBC WITH DIFFERENTIAL/PLATELET  HCG, SERUM, QUALITATIVE      EKG   EKG Interpretation Date/Time:    Ventricular Rate:    PR Interval:    QRS Duration:    QT Interval:    QTC Calculation:   R Axis:      Text Interpretation:         Medicines ordered and prescription drug management: No orders of the defined types were placed in this encounter.   -I have reviewed the patients home medicines and have made adjustments as needed   Cardiac  Monitoring: The patient was maintained on a cardiac monitor.  I personally viewed and interpreted the cardiac monitored which showed an underlying rhythm of: Normal sinus rhythm  Social Determinants of Health:  Factors impacting patients care include: Lack of access to primary care   Reevaluation: After the interventions noted above, I reevaluated the patient and found that they have :improved  Co morbidities that complicate the patient evaluation  Past Medical History:  Diagnosis Date   Seizure-like activity (HCC) 06/30/2023      Dispostion: I considered admission for this patient, however she is appropriate for outpatient follow-up     Final Clinical Impression(s) / ED Diagnoses Final diagnoses:  Seizure-like activity (HCC)     @PCDICTATION @    Afton Horse T, DO 08/04/23 1718

## 2023-08-04 NOTE — ED Notes (Signed)
 IV from EMS discontinued. In tact.

## 2023-08-04 NOTE — Discharge Instructions (Addendum)
 While you were in the emergency room, you had blood work done that was normal.  Like we discussed, a lot of times we do not get clear answers for some of the symptoms that you are describing in the emergency room.  Is important that you follow-up with neurology, discuss having a cardiology follow-up with your PCP.  Continue taking all medications as prescribed.

## 2023-08-04 NOTE — ED Notes (Signed)
 Pt ambulated to restroom and used independently.  This RN accompanied and observed for safety

## 2023-08-05 ENCOUNTER — Encounter (HOSPITAL_COMMUNITY): Payer: Self-pay | Admitting: Licensed Clinical Social Worker

## 2023-08-05 NOTE — Progress Notes (Signed)
   THERAPIST PROGRESS NOTE  Session Time: 12:30pm-1:30pm  Participation Level: Active  Behavioral Response: CasualAlertAnxious, Depressed, and Irritable  Type of Therapy: Individual Therapy  Treatment Goals addressed:  "I just need someone to talk to about my family situation, get some advice, and accept me for who I am. Currently feeling depressed almost every day. Current goal will be to report depression less than 5 days per week.    ProgressTowards Goals: Progressing  Interventions: Motivational Interviewing  Summary: Kayla Nguyen is a 23 y.o. female who presents with Adjustment Disorder with depressed mood.   Suicidal/Homicidal: Nowithout intent/plan  Therapist Response: [name] engaged well in individual and person session.  Clinician explored updates in Kayla Nguyen's life since last appointment.  Clinician provided time and space for Kayla Nguyen to share updates with parents, loss of father, and relationship with boyfriend.  Clinician utilized MI OARS to reflect and summarized thoughts and feelings.  Clinician identified coping skills and scheduled the next appointment to update CCA.  Plan: Return again in 3 weeks.  Diagnosis: Adjustment disorder with depressed mood  Collaboration of Care: Patient refused AEB none required  Patient/Guardian was advised Release of Information must be obtained prior to any record release in order to collaborate their care with an outside provider. Patient/Guardian was advised if they have not already done so to contact the registration department to sign all necessary forms in order for us  to release information regarding their care.   Consent: Patient/Guardian gives verbal consent for treatment and assignment of benefits for services provided during this visit. Patient/Guardian expressed understanding and agreed to proceed.   Merleen Stare Tees Toh, LCSW 08/05/2023

## 2023-08-08 DIAGNOSIS — M9914 Subluxation complex (vertebral) of sacral region: Secondary | ICD-10-CM | POA: Diagnosis not present

## 2023-08-08 DIAGNOSIS — M9913 Subluxation complex (vertebral) of lumbar region: Secondary | ICD-10-CM | POA: Diagnosis not present

## 2023-08-08 DIAGNOSIS — M9915 Subluxation complex (vertebral) of pelvic region: Secondary | ICD-10-CM | POA: Diagnosis not present

## 2023-08-22 DIAGNOSIS — M9914 Subluxation complex (vertebral) of sacral region: Secondary | ICD-10-CM | POA: Diagnosis not present

## 2023-08-22 DIAGNOSIS — M9915 Subluxation complex (vertebral) of pelvic region: Secondary | ICD-10-CM | POA: Diagnosis not present

## 2023-08-22 DIAGNOSIS — M9913 Subluxation complex (vertebral) of lumbar region: Secondary | ICD-10-CM | POA: Diagnosis not present

## 2023-08-23 ENCOUNTER — Other Ambulatory Visit: Payer: Self-pay

## 2023-08-23 DIAGNOSIS — R590 Localized enlarged lymph nodes: Secondary | ICD-10-CM

## 2023-08-24 ENCOUNTER — Inpatient Hospital Stay (HOSPITAL_BASED_OUTPATIENT_CLINIC_OR_DEPARTMENT_OTHER): Admitting: Hematology

## 2023-08-24 ENCOUNTER — Inpatient Hospital Stay: Attending: Hematology

## 2023-08-24 VITALS — BP 124/88 | HR 91 | Temp 97.9°F | Resp 20 | Wt 222.2 lb

## 2023-08-24 DIAGNOSIS — L659 Nonscarring hair loss, unspecified: Secondary | ICD-10-CM | POA: Insufficient documentation

## 2023-08-24 DIAGNOSIS — M25512 Pain in left shoulder: Secondary | ICD-10-CM | POA: Insufficient documentation

## 2023-08-24 DIAGNOSIS — R110 Nausea: Secondary | ICD-10-CM | POA: Diagnosis not present

## 2023-08-24 DIAGNOSIS — M79643 Pain in unspecified hand: Secondary | ICD-10-CM | POA: Insufficient documentation

## 2023-08-24 DIAGNOSIS — R0789 Other chest pain: Secondary | ICD-10-CM

## 2023-08-24 DIAGNOSIS — M7989 Other specified soft tissue disorders: Secondary | ICD-10-CM | POA: Insufficient documentation

## 2023-08-24 DIAGNOSIS — R5383 Other fatigue: Secondary | ICD-10-CM | POA: Insufficient documentation

## 2023-08-24 DIAGNOSIS — L299 Pruritus, unspecified: Secondary | ICD-10-CM | POA: Diagnosis not present

## 2023-08-24 DIAGNOSIS — R590 Localized enlarged lymph nodes: Secondary | ICD-10-CM

## 2023-08-24 DIAGNOSIS — R42 Dizziness and giddiness: Secondary | ICD-10-CM | POA: Diagnosis not present

## 2023-08-24 DIAGNOSIS — R591 Generalized enlarged lymph nodes: Secondary | ICD-10-CM | POA: Insufficient documentation

## 2023-08-24 DIAGNOSIS — M791 Myalgia, unspecified site: Secondary | ICD-10-CM | POA: Diagnosis not present

## 2023-08-24 DIAGNOSIS — G90A Postural orthostatic tachycardia syndrome (POTS): Secondary | ICD-10-CM | POA: Diagnosis not present

## 2023-08-24 LAB — CBC WITH DIFFERENTIAL (CANCER CENTER ONLY)
Abs Immature Granulocytes: 0.02 10*3/uL (ref 0.00–0.07)
Basophils Absolute: 0 10*3/uL (ref 0.0–0.1)
Basophils Relative: 1 %
Eosinophils Absolute: 0.1 10*3/uL (ref 0.0–0.5)
Eosinophils Relative: 2 %
HCT: 42 % (ref 36.0–46.0)
Hemoglobin: 13.8 g/dL (ref 12.0–15.0)
Immature Granulocytes: 0 %
Lymphocytes Relative: 35 %
Lymphs Abs: 2.4 10*3/uL (ref 0.7–4.0)
MCH: 26.4 pg (ref 26.0–34.0)
MCHC: 32.9 g/dL (ref 30.0–36.0)
MCV: 80.3 fL (ref 80.0–100.0)
Monocytes Absolute: 0.5 10*3/uL (ref 0.1–1.0)
Monocytes Relative: 7 %
Neutro Abs: 3.9 10*3/uL (ref 1.7–7.7)
Neutrophils Relative %: 55 %
Platelet Count: 348 10*3/uL (ref 150–400)
RBC: 5.23 MIL/uL — ABNORMAL HIGH (ref 3.87–5.11)
RDW: 13.5 % (ref 11.5–15.5)
WBC Count: 7 10*3/uL (ref 4.0–10.5)
nRBC: 0 % (ref 0.0–0.2)

## 2023-08-24 LAB — CMP (CANCER CENTER ONLY)
ALT: 15 U/L (ref 0–44)
AST: 13 U/L — ABNORMAL LOW (ref 15–41)
Albumin: 4.3 g/dL (ref 3.5–5.0)
Alkaline Phosphatase: 71 U/L (ref 38–126)
Anion gap: 7 (ref 5–15)
BUN: 8 mg/dL (ref 6–20)
CO2: 26 mmol/L (ref 22–32)
Calcium: 9.4 mg/dL (ref 8.9–10.3)
Chloride: 105 mmol/L (ref 98–111)
Creatinine: 0.66 mg/dL (ref 0.44–1.00)
GFR, Estimated: >60 mL/min (ref >60)
Glucose, Bld: 98 mg/dL (ref 70–99)
Potassium: 3.7 mmol/L (ref 3.5–5.1)
Sodium: 138 mmol/L (ref 135–145)
Total Bilirubin: 0.3 mg/dL (ref 0.0–1.2)
Total Protein: 7.7 g/dL (ref 6.5–8.1)

## 2023-08-24 LAB — SEDIMENTATION RATE: Sed Rate: 24 mm/h — ABNORMAL HIGH (ref 0–22)

## 2023-08-24 LAB — LACTATE DEHYDROGENASE: LDH: 147 U/L (ref 98–192)

## 2023-08-24 NOTE — Progress Notes (Signed)
 HEMATOLOGY/ONCOLOGY CLINIC NOTE  Date of Service: 08/24/23  Patient Care Team: Wallace Joesph LABOR, PA as PCP - General (Family Medicine)  CHIEF COMPLAINTS/PURPOSE OF CONSULTATION:  Cervical lymphadenopathy  HISTORY OF PRESENTING ILLNESS:   Kayla Nguyen is a wonderful 23 y.o. female who has been referred to us  by Wallace Joesph LABOR, PA for evaluation and management of lymphadenopathy.   Today, she is accompanied by her mother. Patient complains of relatively new enlarged neck lymph nodes, which have been swollen for over a month. She reports having on enlarged lymph node in the back of the neck at base of her hair. Patient reports having one enlarged lymph node behind her ear which had resolved. She has a lymph node on her left neck which had been present for 2 weeks.   She does feel enlarged lymph nodes at this time, though they have significantly decreased in size. She notes that one of her right neck lymph nodes was previously quarter-sized and is currently dime-sized. She reports that she does have an enlarged lymph node on the left neck which is bigger in size. Patient denies any concern for scalp itchiness or dandruff.  She denies having any other medical issues in the past. Patient denies any previous reason for enlarged lymph nodes. She denies having any surgeries in the past. Patient has no food allergies or known medication allergies.   Patient is not a smoker and denies any alcohol use outside of social use. She denies any use of recreational chemicals.   She reports that her symptoms began with a mild headache, which presented over 1 month ago, after her father passed away. Patient attributed her headache to stress. Her headache was present in the lower back of her head and was persistent. She presented to urgent care and received blood tests which ruled out lime disease among other factors. Patient was sent to her PCP for additional labs which showed normal findings. Patient  did not receive a scan of the neck or chest.  Patient complains of itchiness mainly in her legs. Patient denies any leg swelling.   She reports hand swelling and redness. Patient also reports itchiness in her palms spreading to her arms.   She complains of severe body pain sometimes, mainly localized in the muscles and joints simultaneouly. Patient reports deep pain in the shoulder, knees, and arms. She reports painful wrist joints with no redness in the area.   She also reports redness across her nose and cheeks.   Patient reports that her body pain and overall swelling in the hands are the most bothersome at this time.   She was not on steroids for any time around these symptoms.   She reports that she has been recently declined by rheumatology for an evaluation.  She complains of lower abnominal pain. She also reports abnormal vaginal spotting bleeding since 2-3 weeks with blood clots. Patient has been on depo shot for 6 months and hasn't had a period since December. Patient gets a Depo shot every 3 months.Patient denies any recent injuries. Her bleeding has not been evaluated by OB/GYN.   Patient denies any change in bowel habits, discomfort passing urine, or other infections recently.   Patient does not use any OTC supplements or new cosmetics/soap products. She reports that she does not tend to have seasonal allergies. 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 She reports no recent travel outside of the [place]  in the last 6 months. Patient  works with [employer].  Patient reports that her father had a hx of RA with mildly high Rh factor. It was noted that her father could not use his hands much though symptoms were not significant. She denies any other fhx of autoimmune condition.   Patient was noted to be wearing a medical boot on her right leg. She reports having a fall on a ramp while making a delivery.   Patient reports that she has previously connected with  a counselor to discuss previous issues, which has been beneficial.  She reports back pain in a local area on palpation as well as lower back pain.   Patient complains of pain across the lower abdomen sometimes, which is also painful with pressure.   She reports pain on palpation to the left axillary. She denies any breast pain or lumps in the breast.   She complains of nausea sometimes in the mornings and evenings present for a few weeks. She denies any vomiting. Patient denies any possibility that she may be pregnant. Patient denies any fever, unexpected sudden weight loss, sore throat, rhinnorhea, or cough.   INTERVAL HISTORY:  Kayla Nguyen is a 23 y.o. female here for continued evaluation and management of Cervical lymphadenopathy.   She was last seen by me on 06/24/2023 and fluctuating cervical lymph nodes, center/lower back and shoulder pain, hair loss, itchiness in her arms and legs, primarily in her shoulders and thighs, new sudden headaches that were very bothersome radiating to her eye, hives and skin rashes in her lower extremities primarily triggered by heat, consistent hand redness, hand swelling when she is hot, persistent facial rashes, intermittent skin rashes in upper and lower extremities, and some emotional distress.   Patient is accompanied by her mother during today's visit. She reports that she has been doing fairly well since her last clinical visit.   She complains of continued body pain she describes as very deep aching pain in the bones, usually in the upper back, shoulders, neck, and sometimes in the left upper bicep area. She denies any leg pain. Her body pain is intermittent and does not generally come in the way of daily activities.   Her body pain does not limit her sleep. However, she complains of sleep issues. Pt denies an over-active mind causing sleep issues. Patient notes that she has tried melatonin, but it has not been very effective. She notes that she can  sleep 6-8 hours, but still feel tired after awakening. Patient reports having 6-7x sleeping disturbances a night. Patient complains that her sleep issues have worsened recently. She has not addressed her sleeping issues with her PCP.   Her fatigue is stable.   She does report frequent screen exposure during the day.   Patient denies any fever, chills, night sweats, abdominal pain, mouth sores or leg swelling.   She reports that her enlarged lymph nodes have been fairly stable since her last visit and notes that she is still able to palpate them.   Patient complains of persistent headaches, and notes that her lumps typically feel larger when she has headaches.   She complains of hair loss.   She has not been taking oral iron.   She complains of significant memory issues. Patient notes that she has needed to write down details of her doctor appts due to forgetting. Patient notes that her memory issues have been unchanged since her last visit. She reports that her memory issues are not new.   She reports that heat is  very bothersome.   She is no longer on Depo. Her last depo shot was in January. Pt  was previously on Depo for about 1 year before stooping. She notes that her periods have not yes restarted. Patient reports that her periods were fairly regular prior to being on depo, but notes mild fluctuation every few months.   Patient reports some emotional distress sometimes. She began seeing a therapist which she notes she had seen previously.   Patient does not take any multivitamins on a regular basis.   Patient reports having HR changes with lightheadedness and dizziness. She notes an episode of increased resting HR from 80 bpm to 120 bpm within a span of seconds. She also notes an increase in resting HR from 60 to 110 on one occasion.   Patient reports that during her elevated HR episodes, she typically has elevated blood pressure. Pt notes an incident of 150 systolic. She denies any  concern for low blood pressure.   She reports that her elevated HR episodes typically last 45 minutes- 1 hour. Pt reports an incident of needing to rest for 20 minutes after her elevated HR episode.   Her increased HR episodes are generally present daily. Patient mainly has palpitations/flutters with her elevated HR episodes. She denies any sense of anxiety with her episodes. She reports slight SOB with her increased HR episodes. Patient notes quicker breathing with more elevated HR around 115-120 bpm.   Patient had an appointment with her PCP a couple of weeks ago via video visit. Her PCP referred her to a neurologist due to having seizure episodes. She notes that her neurology appointment is scheduled for the end of this month. Her mother reports that during pt's seizure episodes, she has body twitches and is noted to be completely conscious. Her mother reports that after pt has seizure episodes, she becomes pale and needs to rest. It is also noted that patient's father also had HR issues and was diagnosed with epilepsy.   Patient had a hospital visit 2 weeks ago for having a seizure episode at work. She notes that she is sedentary at work and describes normal temperature in her work environment.   Her seizure episodes usually initially begin with pt feeling very fatigued suddenly. Pt denies any particular triggers for sudden fatigue. Then her HR gradually increases, then she has small twitches in arm, and muscle twitching eventually progresses to both arms and upper body above her waist. Pt notes that she recently also began having eye twitching with episodes too, which is new. She notes that her legs are sometimes involved with her seizure episodes. She notes that her HR changes can sometimes correlate with her seizure episodes.   Patient denies any loss of control of the bowel or bladder. She denies any syncope due to these episodes. But notes near syncope episodes at the start. Pt also notes seeing  floaters often. She does feel lightheaded and unsteady at the start of these episodes.   Patient denies any concern for nightmares. She denies falling asleep at the wheel. She reports some drowsiness in the mornings, which can persist into the early hours of work.   She complains of stress which she attributes to her father passing away recently as well as financial issues. She denies any significant internalized stress.   She notes that she was diagnosed with anxiety 6-7 years ago. She tends to have mild shakiness and mild hives with her anxiety episodes and denies having anxiety during her elevated  HR episodes.   She generally does not consume vegetables in the diet. Patient sometimes consumes fruits in the diet.   She denies any skin rashes or swollen localized/painful joints.   Pt reports blood pooling usually in the hands as well as the legs/knees. She notes that if she stands for 5-10 minutes her legs/knees turn blood red. She notes that in certain positions, her hand has blood pooling. She denies coldness triggering her blood pooling.   She complains of some constipation issues.   She complains of feeling dizzy in clinic today with positional change from lying down to sitting position.   MEDICAL HISTORY:  Past Medical History:  Diagnosis Date   Seizure-like activity (HCC) 06/30/2023    SURGICAL HISTORY: No past surgical history on file.  SOCIAL HISTORY: Social History   Socioeconomic History   Marital status: Single    Spouse name: Not on file   Number of children: Not on file   Years of education: Not on file   Highest education level: GED or equivalent  Occupational History   Not on file  Tobacco Use   Smoking status: Never    Passive exposure: Never   Smokeless tobacco: Never  Vaping Use   Vaping status: Never Used  Substance and Sexual Activity   Alcohol use: No   Drug use: No   Sexual activity: Yes    Birth control/protection: Injection  Other Topics Concern    Not on file  Social History Narrative   Not on file   Social Drivers of Health   Financial Resource Strain: Low Risk  (07/12/2023)   Overall Financial Resource Strain (CARDIA)    Difficulty of Paying Living Expenses: Not very hard  Food Insecurity: No Food Insecurity (07/12/2023)   Hunger Vital Sign    Worried About Running Out of Food in the Last Year: Never true    Ran Out of Food in the Last Year: Never true  Transportation Needs: No Transportation Needs (07/12/2023)   PRAPARE - Administrator, Civil Service (Medical): No    Lack of Transportation (Non-Medical): No  Physical Activity: Insufficiently Active (07/12/2023)   Exercise Vital Sign    Days of Exercise per Week: 3 days    Minutes of Exercise per Session: 20 min  Stress: No Stress Concern Present (07/12/2023)   [employer] of Occupational Health - Occupational Stress Questionnaire    Feeling of Stress : Only a little  Social Connections: Moderately Isolated (07/12/2023)   Social Connection and Isolation Panel    Frequency of Communication with Friends and Family: More than three times a week    Frequency of Social Gatherings with Friends and Family: Twice a week    Attends Religious Services: 1 to 4 times per year    Active Member of [employer] or Organizations: No    Attends Engineer, structural: Not on file    Marital Status: Never married  Intimate Partner Violence: Not At Risk (07/01/2023)   Humiliation, Afraid, Rape, and Kick questionnaire    Fear of Current or Ex-Partner: No    Emotionally Abused: No    Physically Abused: No    Sexually Abused: No    FAMILY HISTORY: Family History  Problem Relation Age of Onset   Polycythemia Mother    Diabetes Father    Prostate cancer Father    Bladder Cancer Father    Diabetes Sister    Diabetes Paternal Grandfather     ALLERGIES:  has no known  allergies.  MEDICATIONS:  Current Outpatient Medications  Medication Sig Dispense Refill    medroxyPROGESTERone  (DEPO-PROVERA ) 150 MG/ML injection Inject 1 mL (150 mg total) into the muscle every 3 (three) months. 1 mL 3   Current Facility-Administered Medications  Medication Dose Route Frequency Provider Last Rate Last Admin   medroxyPROGESTERone  (DEPO-PROVERA ) injection 150 mg  150 mg Intramuscular Q90 days Wallace Search A, PA   150 mg at 01/06/23 1349    REVIEW OF SYSTEMS:    10 Point review of Systems was done is negative except as noted above.   PHYSICAL EXAMINATION: ECOG PERFORMANCE STATUS: 1 - Symptomatic but completely ambulatory .BP 124/88   Pulse 91   Temp 97.9 F (36.6 C)   Resp 20   Wt 222 lb 3.2 oz (100.8 kg)   SpO2 100%   BMI 36.98 kg/m  GENERAL:alert, in no acute distress and comfortable SKIN: no acute rashes, no significant lesions EYES: conjunctiva are pink and non-injected, sclera anicteric OROPHARYNX: MMM, no exudates, no oropharyngeal erythema or ulceration NECK: supple, no JVD LYMPH:  no palpable lymphadenopathy in the cervical, axillary or inguinal regions LUNGS: clear to auscultation b/l with normal respiratory effort HEART: regular rate & rhythm ABDOMEN:  normoactive bowel sounds , non tender, not distended. Extremity: no pedal edema PSYCH: alert & oriented x 3 with fluent speech NEURO: no focal motor/sensory deficits   LABORATORY DATA:  I have reviewed the data as listed  .    Latest Ref Rng & Units 08/24/2023    2:37 PM 08/04/2023    3:06 PM 07/01/2023    6:00 AM  CBC  WBC 4.0 - 10.5 K/uL 7.0  6.9  9.2   Hemoglobin 12.0 - 15.0 g/dL 86.1  87.0  87.5   Hematocrit 36.0 - 46.0 % 42.0  40.6  38.0   Platelets 150 - 400 K/uL 348  333  331     .    Latest Ref Rng & Units 08/24/2023    2:37 PM 08/04/2023    3:06 PM 07/01/2023    6:00 AM  CMP  Glucose 70 - 99 mg/dL 98  98  95   BUN 6 - 20 mg/dL 8  9  13    Creatinine 0.44 - 1.00 mg/dL 9.33  9.25  9.25   Sodium 135 - 145 mmol/L 138  138  139   Potassium 3.5 - 5.1 mmol/L 3.7  3.7  3.8    Chloride 98 - 111 mmol/L 105  105  108   CO2 22 - 32 mmol/L 26  24  21    Calcium 8.9 - 10.3 mg/dL 9.4  9.1  9.0   Total Protein 6.5 - 8.1 g/dL 7.7     Total Bilirubin 0.0 - 1.2 mg/dL 0.3     Alkaline Phos 38 - 126 U/L 71     AST 15 - 41 U/L 13     ALT 0 - 44 U/L 15      Component     Latest Ref Rng 08/24/2023  Sed Rate     0 - 22 mm/hr 24 (H)   LDH     98 - 192 U/L 147     Legend: (H) High  RADIOGRAPHIC STUDIES: I have personally reviewed the radiological images as listed and agreed with the findings in the report. No results found.   ASSESSMENT & PLAN:   23 y.o. female with:  Cervical Lymphadenopathy - significantly improved with time. Likely reactive ? Passing viral infection vs inflammatory  disorder.  PLAN: -Discussed lab results on 08/24/23 in detail with patient. CBC normal, showed WBC of 7K, hemoglobin of 13.8, and platelets of 348K. -patient is not anemic -discussed that her iron is low enough to cause fatigue independent of absence of anemia -ferritin 8 -thyroid  function was noted to be normal last time -CMP stable -sedimentation rate and LDH are not significantly elevated -patient is noted to have borderline enlarged lymph nodes in the posterior triangle of the neck -did not feel any pathologically significant enlarged lymph nodes -patient is unlikely to have a hematological or lymphoproliferative disorder.  -discussed that patient is having a lot of symptoms which are very broad and her differential diagnoses is fairly large, which needs to be evaluated further. Discussed that there can be some overlap including between stress response, panic attacks, hormonal changes, and inflammatory process. Discussed that there is no definitive clear progressive process involved.  -discussed that there can be hormonal changes once she is off of Depo -discussed that her blood pooling is unlikely to be from Raynaud's -discussed importance of iron optimization including for  muscle activity and hair/nail growth  -discussed option of oral iron or IV iron to correct her iron deficiency -patient is inclined to try oral iron at this time -recommend taking 100-150 MG iron polysaccharide daily -she is agreeable to follow up on iron labs with her PCP -her HR is noted to be regular on physical exam today -will refer patient to cardiologist to evaluate her exaggerated vagal response and variable HR especially with dizziness -recommend eating as well as she can with balanced diet -recommend balancing daily logistics in regards to food intake, sleeping habits, and activity  -discussed that there can sometimes be a role for cyclical birth control to stabilize periods and this may be considered by her PCP or OB/GYN -it would not be unreasonable to proceed with upcoming neurologist appointment later this month.  -discussed that there is a need to ensure that her stress response is addressed completely given that stress reactions can explain a lot of her symptoms -continue counseling with her therapist to address emotional distress -recommend sleep optimization  FOLLOW-UP: RTC with PCP Cardiology referral for possible POTS vs r/p other arrhythmias  . Orders Placed This Encounter  Procedures   Ambulatory referral to Cardiology    Referral Priority:   Routine    Referral Type:   Consultation    Referral Reason:   Specialty Services Required    Number of Visits Requested:   1    The total time spent in the appointment was 25 minutes* .  All of the patient's questions were answered with apparent satisfaction. The patient knows to call the clinic with any problems, questions or concerns.   Emaline Saran MD MS AAHIVMS [facility] Hematology/Oncology Physician [facility]  .*Total Encounter Time as defined by the Centers for Medicare and Medicaid Services includes, in addition to the face-to-face time of a patient visit (documented in the note above)  non-face-to-face time: obtaining and reviewing outside history, ordering and reviewing medications, tests or procedures, care coordination (communications with other health care professionals or caregivers) and documentation in the medical record.    I,Mitra Faeizi,acting as a Neurosurgeon for Emaline Saran, MD.,have documented all relevant documentation on the behalf of Emaline Saran, MD,as directed by  Emaline Saran, MD while in the presence of Emaline Saran, MD.  .I have reviewed the above documentation for accuracy and completeness, and I agree with the above. .Adela Esteban Kishore  Ones[place] MD

## 2023-08-25 ENCOUNTER — Encounter (HOSPITAL_COMMUNITY): Payer: Self-pay

## 2023-08-25 ENCOUNTER — Telehealth (INDEPENDENT_AMBULATORY_CARE_PROVIDER_SITE_OTHER): Payer: Self-pay | Admitting: Licensed Clinical Social Worker

## 2023-08-25 DIAGNOSIS — F4321 Adjustment disorder with depressed mood: Secondary | ICD-10-CM

## 2023-08-27 ENCOUNTER — Encounter (HOSPITAL_COMMUNITY): Payer: Self-pay | Admitting: Licensed Clinical Social Worker

## 2023-08-27 NOTE — Progress Notes (Signed)
 Client no showed for this appt

## 2023-09-01 ENCOUNTER — Encounter: Admitting: Radiology

## 2023-09-03 DIAGNOSIS — Z419 Encounter for procedure for purposes other than remedying health state, unspecified: Secondary | ICD-10-CM | POA: Diagnosis not present

## 2023-09-14 NOTE — Progress Notes (Signed)
   23 y.o. No obstetric history on file. female here for ***. Single.  No LMP recorded. Patient has had an injection.    She reports ***. Urine sample provided: ***  Birth control: Dep Injections Last mammogram: Never Sexually active: ***    GYN HISTORY: ***  OB History  No obstetric history on file.   Past Medical History:  Diagnosis Date   Seizure-like activity (HCC) 06/30/2023   No past surgical history on file. Current Outpatient Medications on File Prior to Visit  Medication Sig Dispense Refill   medroxyPROGESTERone  (DEPO-PROVERA ) 150 MG/ML injection Inject 1 mL (150 mg total) into the muscle every 3 (three) months. 1 mL 3   Current Facility-Administered Medications on File Prior to Visit  Medication Dose Route Frequency Provider Last Rate Last Admin   medroxyPROGESTERone  (DEPO-PROVERA ) injection 150 mg  150 mg Intramuscular Q90 days Wallace Search A, PA   150 mg at 01/06/23 1349   No Known Allergies    PE There were no vitals filed for this visit. There is no height or weight on file to calculate BMI.  Physical Exam    Assessment and Plan:        There are no diagnoses linked to this encounter.  Clotilda FORBES Pa, CMA

## 2023-09-15 ENCOUNTER — Ambulatory Visit: Admitting: Neurology

## 2023-09-15 ENCOUNTER — Encounter: Payer: Self-pay | Admitting: Obstetrics and Gynecology

## 2023-09-15 ENCOUNTER — Encounter: Payer: Self-pay | Admitting: Neurology

## 2023-09-15 ENCOUNTER — Ambulatory Visit (INDEPENDENT_AMBULATORY_CARE_PROVIDER_SITE_OTHER): Admitting: Obstetrics and Gynecology

## 2023-09-15 VITALS — BP 132/74 | HR 100 | Temp 98.0°F | Wt 227.0 lb

## 2023-09-15 VITALS — BP 130/78 | Ht 65.0 in | Wt 220.0 lb

## 2023-09-15 DIAGNOSIS — R569 Unspecified convulsions: Secondary | ICD-10-CM

## 2023-09-15 DIAGNOSIS — Z3009 Encounter for other general counseling and advice on contraception: Secondary | ICD-10-CM | POA: Diagnosis not present

## 2023-09-15 NOTE — Progress Notes (Signed)
 [facility]  PATIENT: Kayla Nguyen DOB: [birth year]  REQUESTING CLINICIAN: Chandra Toribio POUR, MD HISTORY FROM: Patient/Mother  REASON FOR VISIT: Seizure like activity    HISTORICAL  CHIEF COMPLAINT:  Chief Complaint  Patient presents with   New Patient (Initial Visit)    Rm 13, with mom Kim, NP- sz like activity started a earlier this year recently lost her father in 05-08-2023. Started prior to his death. Last episode 2023-09-30    HISTORY OF PRESENT ILLNESS:  This is a 23 year old woman past medical history of obesity who is presenting with seizure like activity.  These events started back in January.  She described them as starting to feel tired, then she will have tremor in the right arm that will travel to her head that she will get hot and tremor in the left arm.  Most of the time, it just stays in the upper body but sometimes her lower extremities will be involved.  During these events, she tells me that she can hear, sometimes she can respond by saying yes or no.  Afterward she will feel tired.  This can happen standing, sitting and laying down.  They started in January but now, they are getting worse.  Patient did lose her father in 03/16/25due to acute respiratory distress.  She is currently getting therapy.  With this event, she has not had any injury, no tongue biting or urinary incontinence.  They presented to the hospital in May, were admitted and 1 event was captured and no EEG changes hence nonepileptic events.  Mother tells me that some of these events do occur during sleep, as patient sometimes will come to her room and tell her that she did have an event during sleep, but she did not witness 1 during sleep.  Denies any seizure risk factor, mother tells me that patient's father had the same events during his lifetime.   Handedness: Right handed   Onset: January 2025  Seizure Type: Start with getting tired, tremor in the right hand, then progress to  head and move to left arm. Sometimes legs are involved. Afterward, she feels tired   Current frequency: on average 1 every 2 weeks, sometimes more   Any injuries from seizures: Denies   Seizure risk factors: None reported   Previous ASMs: None   Currenty ASMs: None   ASMs side effects: N/A  Brain Images: Normal head CT   Previous EEGs: Normal EEG    OTHER MEDICAL CONDITIONS: Obesity   REVIEW OF SYSTEMS: Full 14 system review of systems performed and negative with exception of: As noted in the HPI   ALLERGIES: No Known Allergies  HOME MEDICATIONS: Outpatient Medications Prior to Visit  Medication Sig Dispense Refill   medroxyPROGESTERone  (DEPO-PROVERA ) 150 MG/ML injection Inject 1 mL (150 mg total) into the muscle every 3 (three) months. 1 mL 3   medroxyPROGESTERone  (DEPO-PROVERA ) injection 150 mg      No facility-administered medications prior to visit.    PAST MEDICAL HISTORY: Past Medical History:  Diagnosis Date   Seizure-like activity (HCC) 06/30/2023    PAST SURGICAL HISTORY: History reviewed. No pertinent surgical history.  FAMILY HISTORY: Family History  Problem Relation Age of Onset   Polycythemia Mother    Diabetes Father    Prostate cancer Father    Bladder Cancer Father    Diabetes Sister    Diabetes Paternal Grandfather     SOCIAL HISTORY: Social History   Socioeconomic History   Marital status:  Single    Spouse name: Not on file   Number of children: Not on file   Years of education: Not on file   Highest education level: GED or equivalent  Occupational History   Not on file  Tobacco Use   Smoking status: Never    Passive exposure: Never   Smokeless tobacco: Never  Vaping Use   Vaping status: Never Used  Substance and Sexual Activity   Alcohol use: No   Drug use: No   Sexual activity: Yes    Birth control/protection: None  Other Topics Concern   Not on file  Social History Narrative   Right handed   Caffeine-1 cup daily    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, on light duty    Social Drivers of Corporate investment banker Strain: Low Risk  (07/12/2023)   Overall Financial Resource Strain (CARDIA)    Difficulty of Paying Living Expenses: Not very hard  Food Insecurity: No Food Insecurity (07/12/2023)   Hunger Vital Sign    Worried About Running Out of Food in the Last Year: Never true    Ran Out of Food in the Last Year: Never true  Transportation Needs: No Transportation Needs (07/12/2023)   PRAPARE - Administrator, Civil Service (Medical): No    Lack of Transportation (Non-Medical): No  Physical Activity: Insufficiently Active (07/12/2023)   Exercise Vital Sign    Days of Exercise per Week: 3 days    Minutes of Exercise per Session: 20 min  Stress: No Stress Concern Present (07/12/2023)   [employer] of Occupational Health - Occupational Stress Questionnaire    Feeling of Stress : Only a little  Social Connections: Moderately Isolated (07/12/2023)   Social Connection and Isolation Panel    Frequency of Communication with Friends and Family: More than three times a week    Frequency of Social Gatherings with Friends and Family: Twice a week    Attends Religious Services: 1 to 4 times per year    Active Member of [employer] or Organizations: No    Attends Engineer, structural: Not on file    Marital Status: Never married  Intimate Partner Violence: Not At Risk (07/01/2023)   Humiliation, Afraid, Rape, and Kick questionnaire    Fear of Current or Ex-Partner: No    Emotionally Abused: No    Physically Abused: No    Sexually Abused: No    PHYSICAL EXAM  GENERAL EXAM/CONSTITUTIONAL: Vitals:  Vitals:   09/15/23 0819  BP: 130/78  Weight: 220 lb (99.8 kg)  Height: 5' 5 (1.651 m)   Body mass index is 36.61 kg/m. Wt Readings from Last 3 Encounters:  09/15/23 227 lb (103 kg)  09/15/23 220 lb (99.8 kg)  08/24/23 222 lb 3.2 oz (100.8 kg)   Patient is in no distress; well developed, nourished  and groomed; neck is supple  MUSCULOSKELETAL: Gait, strength, tone, movements noted in Neurologic exam below  NEUROLOGIC: MENTAL STATUS:      No data to display         awake, alert, oriented to person, place and time recent and remote memory intact normal attention and concentration language fluent, comprehension intact, naming intact fund of knowledge appropriate  CRANIAL NERVE:  2nd, 3rd, 4th, 6th - Visual fields full to confrontation, extraocular muscles intact, no nystagmus 5th - facial sensation symmetric 7th - facial strength symmetric 8th - hearing intact 9th - palate elevates symmetrically, uvula midline 11th - shoulder shrug symmetric 12th -  tongue protrusion midline  MOTOR:  normal bulk and tone, full strength in the BUE, BLE  SENSORY:  normal and symmetric to light touch  COORDINATION:  finger-nose-finger, fine finger movements normal  GAIT/STATION:  normal   DIAGNOSTIC DATA (LABS, IMAGING, TESTING) - I reviewed patient records, labs, notes, testing and imaging myself where available.  Lab Results  Component Value Date   WBC 7.0 08/24/2023   HGB 13.8 08/24/2023   HCT 42.0 08/24/2023   MCV 80.3 08/24/2023   PLT 348 08/24/2023      Component Value Date/Time   NA 138 08/24/2023 1437   NA 139 02/22/2023 0904   K 3.7 08/24/2023 1437   CL 105 08/24/2023 1437   CO2 26 08/24/2023 1437   GLUCOSE 98 08/24/2023 1437   BUN 8 08/24/2023 1437   BUN 10 02/22/2023 0904   CREATININE 0.66 08/24/2023 1437   CALCIUM 9.4 08/24/2023 1437   PROT 7.7 08/24/2023 1437   PROT 7.5 02/22/2023 0904   ALBUMIN 4.3 08/24/2023 1437   ALBUMIN 4.6 02/22/2023 0904   AST 13 (L) 08/24/2023 1437   ALT 15 08/24/2023 1437   ALKPHOS 71 08/24/2023 1437   BILITOT 0.3 08/24/2023 1437   GFRNONAA >60 08/24/2023 1437   Lab Results  Component Value Date   CHOL 184 02/22/2023   HDL 45 02/22/2023   LDLCALC 120 (H) 02/22/2023   TRIG 104 02/22/2023   Lab Results  Component  Value Date   HGBA1C 5.6 02/22/2023   No results found for: [id number] Lab Results  Component Value Date   TSH 4.035 07/01/2023   MRI Brain 07/01/2023 Normal MRI of the brain.    LTM EEG 07/01/2023 This study is within normal limits. No seizures or epileptiform discharges were seen throughout the recording.   One event was recorded on 07/01/2023 at 1048.  Patient reported feeling confused and was noted to have whole body trembling.  Concomitant EEG did not show any EEG change.  This was a nonepileptic event.   I personally reviewed brain Images and previous EEG reports.   ASSESSMENT AND PLAN  23 y.o. year old female  with who is presenting with seizure-like events.  Based on description, normal studies, 1 event captured with no EEG correlate, I do suspect these are nonepileptic events but mother reports some activity out of sleep.  Because of this additional information, I will obtain a 3-day EEG.  If the ambulatory EEG is normal we will continue to monitor these events.  If any abnormality we will discuss starting antiseizure medication.  This was explained to mother and daughter and they are comfortable with plan.  I also discussed the loss of her father, the grieving process and I encouraged them to continue with psychiatry and therapist.  Return sooner if worse.   1. Seizure-like activity (HCC)   2. Nonepileptic episode [facility])     Patient Instructions  3 day ambulatory EEG, I will contact you to go over the results  Continue to follow up with PCP  Return as needed    Per [place]  DMV statutes, patients with seizures are not allowed to drive until they have been seizure-free for six months.  Other recommendations include using caution when using heavy equipment or power tools. Avoid working on ladders or at heights. Take showers instead of baths.  Do not swim alone.  Ensure the water temperature is not too high on the home water heater. Do not go swimming alone. Do not lock  yourself in  a room alone (i.e. bathroom). When caring for infants or small children, sit down when holding, feeding, or changing them to minimize risk of injury to the child in the event you have a seizure. Maintain good sleep hygiene. Avoid alcohol.  Also recommend adequate sleep, hydration, good diet and minimize stress.   During the Seizure  - First, ensure adequate ventilation and place patients on the floor on their left side  Loosen clothing around the neck and ensure the airway is patent. If the patient is clenching the teeth, do not force the mouth open with any object as this can cause severe damage - Remove all items from the surrounding that can be hazardous. The patient may be oblivious to what's happening and may not even know what he or she is doing. If the patient is confused and wandering, either gently guide him/her away and block access to outside areas - Reassure the individual and be comforting - Call 911. In most cases, the seizure ends before EMS arrives. However, there are cases when seizures may last over 3 to 5 minutes. Or the individual may have developed breathing difficulties or severe injuries. If a pregnant patient or a person with diabetes develops a seizure, it is prudent to call an ambulance. - Finally, if the patient does not regain full consciousness, then call EMS. Most patients will remain confused for about 45 to 90 minutes after a seizure, so you must use judgment in calling for help. - Avoid restraints but make sure the patient is in a bed with padded side rails - Place the individual in a lateral position with the neck slightly flexed; this will help the saliva drain from the mouth and prevent the tongue from falling backward - Remove all nearby furniture and other hazards from the area - Provide verbal assurance as the individual is regaining consciousness - Provide the patient with privacy if possible - Call for help and start treatment as ordered by the  caregiver   After the Seizure (Postictal Stage)  After a seizure, most patients experience confusion, fatigue, muscle pain and/or a headache. Thus, one should permit the individual to sleep. For the next few days, reassurance is essential. Being calm and helping reorient the person is also of importance.  Most seizures are painless and end spontaneously. Seizures are not harmful to others but can lead to complications such as stress on the lungs, brain and the heart. Individuals with prior lung problems may develop labored breathing and respiratory distress.    Discussed Patients with epilepsy have a small risk of sudden unexpected death, a condition referred to as sudden unexpected death in epilepsy (SUDEP). SUDEP is defined specifically as the sudden, unexpected, witnessed or unwitnessed, nontraumatic and nondrowning death in patients with epilepsy with or without evidence for a seizure, and excluding documented status epilepticus, in which post mortem examination does not reveal a structural or toxicologic cause for death     No orders of the defined types were placed in this encounter.   No orders of the defined types were placed in this encounter.   Return if symptoms worsen or fail to improve.  I personally spent a total of 65 minutes in the care of the patient today including preparing to see the patient, getting/reviewing separately obtained history, performing a medically appropriate exam/evaluation, counseling and educating, placing orders, documenting clinical information in the EHR, independently interpreting results, and communicating results.   Pastor Falling, MD 09/15/2023, 12:21 PM  [facility] [phone]  [address], [address] [place], [place] [address] [phone]

## 2023-09-15 NOTE — Patient Instructions (Signed)
 3 day ambulatory EEG, I will contact you to go over the results  Continue to follow up with PCP  Return as needed

## 2023-09-21 HISTORY — PX: ANKLE FRACTURE SURGERY: SHX122

## 2023-09-26 ENCOUNTER — Ambulatory Visit: Admitting: Family Medicine

## 2023-09-29 ENCOUNTER — Ambulatory Visit (INDEPENDENT_AMBULATORY_CARE_PROVIDER_SITE_OTHER): Admitting: Licensed Clinical Social Worker

## 2023-09-29 DIAGNOSIS — F4321 Adjustment disorder with depressed mood: Secondary | ICD-10-CM | POA: Diagnosis not present

## 2023-10-01 ENCOUNTER — Encounter (HOSPITAL_COMMUNITY): Payer: Self-pay | Admitting: Licensed Clinical Social Worker

## 2023-10-01 NOTE — Progress Notes (Signed)
 Virtual Visit via Video Note  I connected with Kayla Nguyen on 09/29/23 at  3:30 PM EDT by a video enabled telemedicine application and verified that I am speaking with the correct person using two identifiers.  Location: Patient: Home Provider: Home office   I discussed the limitations of evaluation and management by telemedicine and the availability of in person appointments. The patient expressed understanding and agreed to proceed.    I discussed the assessment and treatment plan with the patient. The patient was provided an opportunity to ask questions and all were answered. The patient agreed with the plan and demonstrated an understanding of the instructions.   The patient was advised to call back or seek an in-person evaluation if the symptoms worsen or if the condition fails to improve as anticipated.  I provided 45 minutes of non-face-to-face time during this encounter.   [name], LCSW   THERAPIST PROGRESS NOTE  Session Time: 3:30pm-4:15pm  Participation Level: Active  Behavioral Response: NeatAlertDepressed  Type of Therapy: Individual Therapy  Treatment Goals addressed: "I just need someone to talk to about my family situation, get some advice, and accept me for who I am. Currently feeling depressed almost every day. Current goal will be to report depression less than 5 days per week.   ProgressTowards Goals: Progressing  Interventions: CBT  Summary: Kayla Nguyen is a 23 y.o. female who presents with Adjustment Disorder with depressed mood.   Suicidal/Homicidal: Nowithout intent/plan  Therapist Response: Jataya engaged well in individual virtual session with Facilities manager.  Clinician utilized CBT to process thoughts, feelings, and behaviors.  Clinician noted recent surgery on ankle, which has Rosina immobile at home for the next few weeks.  Clinician explored activities that can be done from a seated position.  Clinician also identified the importance of  communication and socialization with friends and family to avoid loneliness.  Clinician discussed family relationships and explored impact of new closeness with older sister.  Clinician identified that older sister is Vinia's best friend and support.  Tokiko shared that relationship with mother has been very good.  Mother has been very supportive and actually is healing.  Actually also identified that fianc has been wonderful, although he has not been able to visit or be present this week due to transportation issues.  Plan: Return again in 4 weeks.  Diagnosis: Adjustment disorder with depressed mood  Collaboration of Care: Patient refused AEB none required  Patient/Guardian was advised Release of Information must be obtained prior to any record release in order to collaborate their care with an outside provider. Patient/Guardian was advised if they have not already done so to contact the registration department to sign all necessary forms in order for us  to release information regarding their care.   Consent: Patient/Guardian gives verbal consent for treatment and assignment of benefits for services provided during this visit. Patient/Guardian expressed understanding and agreed to proceed.   Harlene JONELLE Brinsmade, LCSW 10/01/2023

## 2023-10-04 DIAGNOSIS — Z419 Encounter for procedure for purposes other than remedying health state, unspecified: Secondary | ICD-10-CM | POA: Diagnosis not present

## 2023-10-17 ENCOUNTER — Ambulatory Visit: Payer: Self-pay | Admitting: Obstetrics and Gynecology

## 2023-10-17 ENCOUNTER — Encounter: Payer: Self-pay | Admitting: Obstetrics and Gynecology

## 2023-10-17 ENCOUNTER — Ambulatory Visit (INDEPENDENT_AMBULATORY_CARE_PROVIDER_SITE_OTHER): Admitting: Obstetrics and Gynecology

## 2023-10-17 VITALS — BP 126/78 | HR 95 | Temp 97.7°F | Wt 220.0 lb

## 2023-10-17 DIAGNOSIS — Z30017 Encounter for initial prescription of implantable subdermal contraceptive: Secondary | ICD-10-CM

## 2023-10-17 DIAGNOSIS — Z01812 Encounter for preprocedural laboratory examination: Secondary | ICD-10-CM | POA: Diagnosis not present

## 2023-10-17 LAB — PREGNANCY, URINE: Preg Test, Ur: NEGATIVE

## 2023-10-17 MED ORDER — ETONOGESTREL 68 MG ~~LOC~~ IMPL
68.0000 mg | DRUG_IMPLANT | Freq: Once | SUBCUTANEOUS | Status: AC
  Administered 2023-10-17: 68 mg via SUBCUTANEOUS

## 2023-10-17 MED ORDER — LIDOCAINE HCL (PF) 1 % IJ SOLN: 3.0000 mL | Freq: Once | INTRAMUSCULAR | Status: DC

## 2023-10-17 NOTE — Progress Notes (Signed)
    23 y.o. G0P0000 female here for Nexplanon  insertion. Single, relationship x65yr.   Patient's last menstrual period was 10/24/2022 (approximate).   Hx of depo injections.  Use for 1 year in 2021 and stopped due to concerns for bone pain.  Restarted last year with no return of symptoms.  However she has had difficulty keeping up with Depo.  She would like to try the Nexplanon . She is also used the contraceptive patch however this did not stay on her skin well.    Birth control: condoms, last Depo >3 months ago  GYN HISTORY: No sig hx  OB History  Gravida Para Term Preterm AB Living  0 0 0 0 0 0  SAB IAB Ectopic Multiple Live Births  0 0 0 0 0    Past Medical History:  Diagnosis Date   Seizure-like activity (HCC) 06/30/2023    Past Surgical History:  Procedure Laterality Date   ANKLE FRACTURE SURGERY Right 09/21/2023    Current Outpatient Medications on File Prior to Visit  Medication Sig Dispense Refill   Ferrous Sulfate (IRON PO) Take by mouth daily.     No current facility-administered medications on file prior to visit.    No Known Allergies    PE Today's Vitals   10/17/23 0958  BP: 126/78  Pulse: 95  Temp: 97.7 F (36.5 C)  TempSrc: Oral  SpO2: 97%  Weight: 220 lb (99.8 kg)   Body mass index is 36.61 kg/m.  Physical Exam Vitals reviewed.  Constitutional:      General: She is not in acute distress.    Appearance: Normal appearance.  HENT:     Head: Normocephalic and atraumatic.     Nose: Nose normal.  Eyes:     Extraocular Movements: Extraocular movements intact.     Conjunctiva/sclera: Conjunctivae normal.  Pulmonary:     Effort: Pulmonary effort is normal.  Musculoskeletal:        General: Normal range of motion.     Cervical back: Normal range of motion.  Neurological:     General: No focal deficit present.     Mental Status: She is alert.  Psychiatric:        Mood and Affect: Mood normal.        Behavior: Behavior normal.      Nexplanon  insertion: Consent was signed. Timeout was performed. Patient placed supine on exam table with her left arm flexed at the elbow.  Area cleansed with Betadine x 3 and draped in normal sterile fashion.  Insertion site and surrounding tissue anesthetized with 1% Lidocaine  (Lot # [id number], Exp 2026-September) without epinephrine, 3 cc total used.  Attention was then turned to insertion of a new device, Nexplanon  (Lot # [id number], Exp 2027-0 6).  After confirming presence of rod in device, skin was pierced and then elevated along insertion path, passing device just under the skin.  Rod released and device inserted.  Rod palpated easily by myself and patient.  Steri-strips applied and a pressure bandage was placed over site.  Entire procedure performed with sterile technique.     Assessment and Plan:        Nexplanon  insertion -     Insertion of implanon  rod -     Etonogestrel  -     Lidocaine  HCl (PF)  Pre-procedure lab exam -     Pregnancy, urine   Uncomplicated insertion RTO for annual exam, next year  Vera LULLA Pa, MD

## 2023-10-26 ENCOUNTER — Other Ambulatory Visit: Payer: Self-pay | Admitting: Neurology

## 2023-10-26 ENCOUNTER — Encounter: Payer: Self-pay | Admitting: Neurology

## 2023-10-26 ENCOUNTER — Ambulatory Visit (HOSPITAL_COMMUNITY): Admitting: Licensed Clinical Social Worker

## 2023-10-26 DIAGNOSIS — R569 Unspecified convulsions: Secondary | ICD-10-CM

## 2023-10-26 NOTE — Telephone Encounter (Signed)
 EEG ordered. Thanks

## 2023-10-27 NOTE — Telephone Encounter (Signed)
 EMAILED TO [facility] THE AMBULATORY EEG ORDER:

## 2023-11-04 DIAGNOSIS — Z419 Encounter for procedure for purposes other than remedying health state, unspecified: Secondary | ICD-10-CM | POA: Diagnosis not present

## 2023-11-09 ENCOUNTER — Ambulatory Visit (INDEPENDENT_AMBULATORY_CARE_PROVIDER_SITE_OTHER): Admitting: Licensed Clinical Social Worker

## 2023-11-09 DIAGNOSIS — F4321 Adjustment disorder with depressed mood: Secondary | ICD-10-CM | POA: Diagnosis not present

## 2023-11-11 ENCOUNTER — Encounter (HOSPITAL_COMMUNITY): Payer: Self-pay | Admitting: Licensed Clinical Social Worker

## 2023-11-11 DIAGNOSIS — H6501 Acute serous otitis media, right ear: Secondary | ICD-10-CM | POA: Diagnosis not present

## 2023-11-11 DIAGNOSIS — Z6837 Body mass index (BMI) 37.0-37.9, adult: Secondary | ICD-10-CM | POA: Diagnosis not present

## 2023-11-11 DIAGNOSIS — J029 Acute pharyngitis, unspecified: Secondary | ICD-10-CM | POA: Diagnosis not present

## 2023-11-11 DIAGNOSIS — B349 Viral infection, unspecified: Secondary | ICD-10-CM | POA: Diagnosis not present

## 2023-11-11 DIAGNOSIS — R059 Cough, unspecified: Secondary | ICD-10-CM | POA: Diagnosis not present

## 2023-11-11 NOTE — Progress Notes (Signed)
 Virtual Visit via Video Note  I connected with Kayla Nguyen on 11/09/23 at  2:30 PM EDT by a video enabled telemedicine application and verified that I am speaking with the correct person using two identifiers.  Location: Patient: home Provider: home office   I discussed the limitations of evaluation and management by telemedicine and the availability of in person appointments. The patient expressed understanding and agreed to proceed.   I discussed the assessment and treatment plan with the patient. The patient was provided an opportunity to ask questions and all were answered. The patient agreed with the plan and demonstrated an understanding of the instructions.   The patient was advised to call back or seek an in-person evaluation if the symptoms worsen or if the condition fails to improve as anticipated.  I provided 45 minutes of non-face-to-face time during this encounter.   Kayla JONELLE Rosser, LCSW   THERAPIST PROGRESS NOTE  Session Time: 2:30pm-3:15pm  Participation Level: Active  Behavioral Response: Well GroomedAlertDepressed  Type of Therapy: Individual Therapy  Treatment Goals addressed: "I just need someone to talk to about my family situation, get some advice, and accept me for who I am. Currently feeling depressed almost every day. Current goal will be to report depression less than 5 days per week.   ProgressTowards Goals: Progressing  Interventions: CBT  Summary: Kayla Nguyen is a 23 y.o. female who presents with Adjustment disorder with depressed mood.   Suicidal/Homicidal: Nowithout intent/plan  Therapist Response: Kayla Nguyen engaged well in individual virtual session with Facilities manager. Clinician utilized CBT to process thoughts, feelings, and interactions. Clinician processed struggles with healing following surgery. Clinician also processed recent decision to end relationship with fiancee. Clinician provided time and space for Kayla Nguyen to share her decision  making process and the way she was able to stand up for herself based on the evidence collected. Clinician noted the importance of valuing herself and her own self worth that makes her fight for her best interests. Clinician identified a lot of support from sister, who is new in her life, but seems to be extremely supportive and trustworthy.   Plan: Return again in 2 weeks.  Diagnosis: Adjustment disorder with depressed mood  Collaboration of Care: Medication Management AEB referred to med management in order to discuss antidepressants for high levels of anxiety  Patient/Guardian was advised Release of Information must be obtained prior to any record release in order to collaborate their care with an outside provider. Patient/Guardian was advised if they have not already done so to contact the registration department to sign all necessary forms in order for us  to release information regarding their care.   Consent: Patient/Guardian gives verbal consent for treatment and assignment of benefits for services provided during this visit. Patient/Guardian expressed understanding and agreed to proceed.   Kayla JONELLE Beverly, LCSW 11/11/2023

## 2023-11-14 ENCOUNTER — Encounter: Payer: Self-pay | Admitting: *Deleted

## 2023-11-15 ENCOUNTER — Ambulatory Visit: Attending: Cardiology | Admitting: Cardiology

## 2023-11-15 ENCOUNTER — Encounter: Payer: Self-pay | Admitting: Cardiology

## 2023-11-15 ENCOUNTER — Other Ambulatory Visit (HOSPITAL_COMMUNITY): Payer: Self-pay

## 2023-11-15 ENCOUNTER — Ambulatory Visit (INDEPENDENT_AMBULATORY_CARE_PROVIDER_SITE_OTHER)

## 2023-11-15 VITALS — BP 130/92 | HR 98 | Ht 65.0 in | Wt 220.6 lb

## 2023-11-15 DIAGNOSIS — R002 Palpitations: Secondary | ICD-10-CM

## 2023-11-15 DIAGNOSIS — R0609 Other forms of dyspnea: Secondary | ICD-10-CM | POA: Insufficient documentation

## 2023-11-15 MED ORDER — BLOOD PRESSURE MONITOR AUTOMAT DEVI: 1.0000 [IU] | Freq: Once | 0 refills | Status: DC

## 2023-11-15 MED ORDER — PROPRANOLOL HCL 10 MG PO TABS
10.0000 mg | ORAL_TABLET | Freq: Every evening | ORAL | 3 refills | Status: AC
  Filled 2023-11-15: qty 90, 90d supply, fill #0

## 2023-11-15 MED ORDER — BLOOD PRESSURE MONITOR AUTOMAT DEVI
1.0000 [IU] | Freq: Once | 0 refills | Status: AC
Start: 2023-11-15 — End: 2023-11-16
  Filled 2023-11-15: qty 1, 30d supply, fill #0

## 2023-11-15 MED ORDER — PROPRANOLOL HCL 10 MG PO TABS: 10.0000 mg | ORAL_TABLET | Freq: Every evening | ORAL | 3 refills | Status: DC

## 2023-11-15 NOTE — Progress Notes (Unsigned)
 Cardiology Office Note:    Date:  11/16/2023   ID:  Kayla Nguyen, DOB [birth year], MRN [id number]  PCP:  Wallace Joesph LABOR, PA  Cardiologist:  Rhyder Koegel, DO  Electrophysiologist:  None   Referring MD: Onesimo Emaline Brink, MD    I am having multiple symptoms  History of Present Illness:    Kayla Nguyen is a 23 y.o. female  who presents with episodes of heart palpitations and dizziness. She is accompanied by her mother.  She tells me that she has been suffering from symptoms of palpitations. It first started as intermittent episodes and then in December 2024 started to become more frequent. These episodes occur randomly and are characterized by a rapid and forceful heartbeat lasting approximately 20 minutes. They are accompanied by shortness of breath, which is not relieved by deep breathing. Dizziness occurs particularly when turning her head quickly, sitting up, or standing, but she has never lost consciousness. The frequency of these episodes varies, with some weeks having daily occurrences and other times going a week or two without symptoms. A significant episode at work left her feeling close to passing out.  Her family history includes significant cardiac events. Her grandfathers had heart disease, with one undergoing a valve replacement and the other dying from a massive heart attack. Her paternal grandfather also had heart attacks and passed away in his sixties.  She denies any recent changes in her health or lifestyle, such as car accidents, and has not been working for about two months due to her symptoms. She also experiences her arms and legs turning red when standing, hair loss, and extreme sensitivity to heat, but has not consulted an endocrinologist for these symptoms.  Past Medical History:  Diagnosis Date   Seizure-like activity (HCC) 06/30/2023    Past Surgical History:  Procedure Laterality Date   ANKLE FRACTURE SURGERY Right 09/21/2023    Current  Medications: Current Meds  Medication Sig   Ferrous Sulfate (IRON PO) Take by mouth daily.   [DISCONTINUED] Blood Pressure Monitoring (BLOOD PRESSURE MONITOR AUTOMAT) DEVI 1 Units by Does not apply route once for 1 dose.   [DISCONTINUED] propranolol  (INDERAL ) 10 MG tablet Take 1 tablet (10 mg total) by mouth at bedtime.   Current Facility-Administered Medications for the 11/15/23 encounter (Office Visit) with Roey Coopman, DO  Medication   lidocaine  (PF) (XYLOCAINE ) 1 % injection 3 mL     Allergies:   Patient has no known allergies.   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, on light duty    Social Drivers of Health   Financial Resource Strain: Low Risk  (07/12/2023)   Overall Financial Resource Strain (CARDIA)    Difficulty of Paying Living Expenses: Not very hard  Food Insecurity: No Food Insecurity (07/12/2023)  Hunger Vital Sign    Worried About Running Out of Food in the Last Year: Never true    Ran Out of Food in the Last Year: Never true  Transportation Needs: No Transportation Needs (07/12/2023)   PRAPARE - Administrator, Civil Service (Medical): No    Lack of Transportation (Non-Medical): No  Physical Activity: Insufficiently Active (07/12/2023)   Exercise Vital Sign    Days of Exercise per Week: 3 days    Minutes of Exercise per Session: 20 min  Stress: No Stress Concern  Present (07/12/2023)   [employer] of Occupational Health - Occupational Stress Questionnaire    Feeling of Stress : Only a little  Social Connections: Moderately Isolated (07/12/2023)   Social Connection and Isolation Panel    Frequency of Communication with Friends and Family: More than three times a week    Frequency of Social Gatherings with Friends and Family: Twice a week    Attends Religious Services: 1 to 4 times per year    Active Member of [employer] or Organizations: No    Attends Engineer, structural: Not on file    Marital Status: Never married     Family History: The patient's family history includes Bladder Cancer in her father; Diabetes in her father, paternal grandfather, and sister; Polycythemia in her mother; Prostate cancer in her father.  ROS:   Review of Systems  Constitution: Negative for decreased appetite, fever and weight gain.  HENT: Negative for congestion, ear discharge, hoarse voice and sore throat.   Eyes: Negative for discharge, redness, vision loss in right eye and visual halos.  Cardiovascular: Negative for chest pain, dyspnea on exertion, leg swelling, orthopnea and palpitations.  Respiratory: Negative for cough, hemoptysis, shortness of breath and snoring.   Endocrine: Negative for heat intolerance and polyphagia.  Hematologic/Lymphatic: Negative for bleeding problem. Does not bruise/bleed easily.  Skin: Negative for flushing, nail changes, rash and suspicious lesions.  Musculoskeletal: Negative for arthritis, joint pain, muscle cramps, myalgias, neck pain and stiffness.  Gastrointestinal: Negative for abdominal pain, bowel incontinence, diarrhea and excessive appetite.  Genitourinary: Negative for decreased libido, genital sores and incomplete emptying.  Neurological: Negative for brief paralysis, focal weakness, headaches and loss of balance.  Psychiatric/Behavioral: Negative for altered mental status, depression and suicidal ideas.   Allergic/Immunologic: Negative for HIV exposure and persistent infections.    EKGs/Labs/Other Studies Reviewed:    The following studies were reviewed today:   EKG:  The ekg ordered today demonstrates   Recent Labs: 07/01/2023: Magnesium 2.0; TSH 4.035 08/24/2023: ALT 15; BUN 8; Creatinine 0.66; Hemoglobin 13.8; Platelet Count 348; Potassium 3.7; Sodium 138  Recent Lipid Panel    Component Value Date/Time   CHOL 184 02/22/2023 0904   TRIG 104 02/22/2023 0904   HDL 45 02/22/2023 0904   CHOLHDL 4.1 02/22/2023 0904   LDLCALC 120 (H) 02/22/2023 0904    Physical Exam:    VS:  BP (!) 130/92 (BP Location: Right Arm, Patient Position: Sitting, Cuff Size: Normal)   Pulse 98   Ht 5' 5 (1.651 m)   Wt 220 lb 9.6 oz (100.1 kg)   LMP 10/24/2022 (Approximate)   SpO2 99%   BMI 36.71 kg/m     Wt Readings from Last 3 Encounters:  11/15/23 220 lb 9.6 oz (100.1 kg)  10/17/23 220 lb (99.8 kg)  09/15/23 227 lb (103 kg)     GEN: Well nourished, well developed in no acute distress HEENT: Normal NECK: No JVD;  No carotid bruits LYMPHATICS: No lymphadenopathy CARDIAC: S1S2 noted,RRR, no murmurs, rubs, gallops RESPIRATORY:  Clear to auscultation without rales, wheezing or rhonchi  ABDOMEN: Soft, non-tender, non-distended, +bowel sounds, no guarding. EXTREMITIES: No edema, No cyanosis, no clubbing, left leg in cast MUSCULOSKELETAL:  No deformity  SKIN: Warm and dry NEUROLOGIC:  Alert and oriented x 3, non-focal PSYCHIATRIC:  Normal affect, good insight  ASSESSMENT:    1. Palpitations   2. DOE (dyspnea on exertion)   3. Morbid obesity (HCC)    PLAN:    Palpitations and tachycardia Intermittent palpitations and tachycardia with shortness of breath and dizziness. Differential includes inappropriate sinus tachycardia, arrhythmia, dysautonomia with postural orthostatic tachycardia syndrome. - Order 14-day cardiac event monitor to assess heart rhythm during episodes. - Order  echocardiogram to evaluate cardiac structure and function. - Prescribe propranolol  20 mg to be started after monitor removal, to be taken at night. - Adjust propranolol  dosage based on response, potentially increasing to extended-release formulation.  Dizziness Dizziness may linked to tachycardia and positional changes, possibly due to dysautonomia or orthostatic changes.  Color changes in extremities with standing and temperature sensitivity was referred to rheumatology but the rheumatologist declined to see the patient or mother.   Elevated blood pressure -  Consistently elevated systolic blood pressure, possibly indicating hypertension or anxiety-related. - Instruct her to monitor blood pressure at home twice daily, morning and night. - Upload blood pressure readings to MyChart after two weeks for review.  The patient is in agreement with the above plan. The patient left the office in stable condition.  The patient will follow up in   Medication Adjustments/Labs and Tests Ordered: Current medicines are reviewed at length with the patient today.  Concerns regarding medicines are outlined above.  Orders Placed This Encounter  Procedures   LONG TERM MONITOR (3-14 DAYS)   ECHOCARDIOGRAM COMPLETE   Meds ordered this encounter  Medications   DISCONTD: propranolol  (INDERAL ) 10 MG tablet    Sig: Take 1 tablet (10 mg total) by mouth at bedtime.    Dispense:  90 tablet    Refill:  3   DISCONTD: Blood Pressure Monitoring (BLOOD PRESSURE MONITOR AUTOMAT) DEVI    Sig: 1 Units by Does not apply route once for 1 dose.    Dispense:  1 each    Refill:  0   propranolol  (INDERAL ) 10 MG tablet    Sig: Take 1 tablet (10 mg total) by mouth at bedtime.    Dispense:  90 tablet    Refill:  3   Blood Pressure Monitoring (BLOOD PRESSURE MONITOR AUTOMAT) DEVI    Sig: Use to monitor blood pressure as directed.    Dispense:  1 each    Refill:  0    Patient Instructions  Medication Instructions:   Your physician has recommended you make the following change in your medication:  START: Propranolol  10 mg nightly *If you need a refill on your cardiac medications before your next appointment, please call your pharmacy*  Testing/Procedures: Your physician has requested that you have an echocardiogram. Echocardiography is a painless test that uses sound waves to create images of your heart. It provides your doctor with information about the size and shape of your heart and how well your heart's chambers and valves are working. This procedure takes approximately one hour. There are no restrictions for this procedure. Please do NOT wear cologne, perfume, aftershave, or lotions (deodorant is allowed). Please arrive 15 minutes prior to your appointment time.  Please  note: We ask at that you not bring children with you during ultrasound (echo/ vascular) testing. Due to room size and safety concerns, children are not allowed in the ultrasound rooms during exams. Our front office staff cannot provide observation of children in our lobby area while testing is being conducted. An adult accompanying a patient to their appointment will only be allowed in the ultrasound room at the discretion of the ultrasound technician under special circumstances. We apologize for any inconvenience.  ZIO XT- Long Term Monitor Instructions  Your physician has requested you wear a ZIO patch monitor for 14 days.  This is a single patch monitor. [employer] supplies one patch monitor per enrollment. Additional stickers are not available. Please do not apply patch if you will be having a Nuclear Stress Test,  Echocardiogram, Cardiac CT, MRI, or Chest Xray during the period you would be wearing the  monitor. The patch cannot be worn during these tests. You cannot remove and re-apply the  ZIO XT patch monitor.  Your ZIO patch monitor will be mailed 3 day USPS to your address on file. It may take 3-5 days  to receive your monitor  after you have been enrolled.  Once you have received your monitor, please review the enclosed instructions. Your monitor  has already been registered assigning a specific monitor serial # to you.  Billing and Patient Assistance Program Information  We have supplied [employer] with any of your insurance information on file for billing purposes. [employer] offers a sliding scale Patient Assistance Program for patients that do not have  insurance, or whose insurance does not completely cover the cost of the ZIO monitor.  You must apply for the Patient Assistance Program to qualify for this discounted rate.  To apply, please call [employer] at [phone], select option 4, select option 2, ask to apply for  Patient Assistance Program. Meredeth will ask your household income, and how many people  are in your household. They will quote your out-of-pocket cost based on that information.  [employer] will also be able to set up a 68-month, interest-free payment plan if needed.  Applying the monitor   Shave hair from upper left chest.  Hold abrader disc by orange tab. Rub abrader in 40 strokes over the upper left chest as  indicated in your monitor instructions.  Clean area with 4 enclosed alcohol pads. Let dry.  Apply patch as indicated in monitor instructions. Patch will be placed under collarbone on left  side of chest with arrow pointing upward.  Rub patch adhesive wings for 2 minutes. Remove white label marked 1. Remove the white  label marked 2. Rub patch adhesive wings for 2 additional minutes.  While looking in a mirror, press and release button in center of patch. A small green light will  flash 3-4 times. This will be your only indicator that the monitor has been turned on.  Do not shower for the first 24 hours. You may shower after the first 24 hours.  Press the button if you feel a symptom. You will hear a small click. Record Date, Time and  Symptom in the Patient Logbook.  When you are  ready to remove the patch, follow instructions on the last 2 pages of Patient  Logbook. Stick patch monitor onto the last page of Patient Logbook.  Place Patient Logbook in the blue and white box. Use locking tab on box and tape box closed  securely. The blue and white box has prepaid postage on it.  Please place it in the mailbox as  soon as possible. Your physician should have your test results approximately 7 days after the  monitor has been mailed back to [facility].  Call [facility] Customer Care at [phone] if you have questions regarding  your ZIO XT patch monitor. Call them immediately if you see an orange light blinking on your  monitor.  If your monitor falls off in less than 4 days, contact our Monitor department at [phone].  If your monitor becomes loose or falls off after 4 days call [employer] at [phone] for  suggestions on securing your monitor   Follow-Up: At [facility], you and your health needs are our priority.  As part of our continuing mission to provide you with exceptional heart care, our providers are all part of one team.  This team includes your primary Cardiologist (physician) and Advanced Practice Providers or APPs (Physician Assistants and Nurse Practitioners) who all work together to provide you with the care you need, when you need it.  Your next appointment:   12 week(s)  Provider:   Duff Pozzi, DO     Adopting a Healthy Lifestyle.  Know what a healthy weight is for you (roughly BMI <25) and aim to maintain this   Aim for 7+ servings of fruits and vegetables daily   65-80+ fluid ounces of water or unsweet tea for healthy kidneys   Limit to max 1 drink of alcohol per day; avoid smoking/tobacco   Limit animal fats in diet for cholesterol and heart health - choose grass fed whenever available   Avoid highly processed foods, and foods high in saturated/trans fats   Aim for low stress - take time to unwind and care for  your mental health   Aim for 150 min of moderate intensity exercise weekly for heart health, and weights twice weekly for bone health   Aim for 7-9 hours of sleep daily   When it comes to diets, agreement about the perfect plan isnt easy to find, even among the experts. Experts at the [facility] [employer] developed an idea known as the Healthy Eating Plate. Just imagine a plate divided into logical, healthy portions.   The emphasis is on diet quality:   Load up on vegetables and fruits - one-half of your plate: Aim for color and variety, and remember that potatoes dont count.   Go for whole grains - one-quarter of your plate: Whole wheat, barley, wheat berries, quinoa, oats, brown rice, and foods made with them. If you want pasta, go with whole wheat pasta.   Protein power - one-quarter of your plate: Fish, chicken, beans, and nuts are all healthy, versatile protein sources. Limit red meat.   The diet, however, does go beyond the plate, offering a few other suggestions.   Use healthy plant oils, such as olive, canola, soy, corn, sunflower and peanut. Check the labels, and avoid partially hydrogenated oil, which have unhealthy trans fats.   If youre thirsty, drink water. Coffee and tea are good in moderation, but skip sugary drinks and limit milk and dairy products to one or two daily servings.   The type of carbohydrate in the diet is more important than the amount. Some sources of carbohydrates, such as vegetables, fruits, whole grains, and beans-are healthier than others.   Finally, stay active  Signed, Alichia Alridge, DO  11/16/2023 2:22 PM    [facility]

## 2023-11-15 NOTE — Patient Instructions (Signed)
 Medication Instructions:  Your physician has recommended you make the following change in your medication:  START: Propranolol  10 mg nightly *If you need a refill on your cardiac medications before your next appointment, please call your pharmacy*  Testing/Procedures: Your physician has requested that you have an echocardiogram. Echocardiography is a painless test that uses sound waves to create images of your heart. It provides your doctor with information about the size and shape of your heart and how well your heart's chambers and valves are working. This procedure takes approximately one hour. There are no restrictions for this procedure. Please do NOT wear cologne, perfume, aftershave, or lotions (deodorant is allowed). Please arrive 15 minutes prior to your appointment time.  Please note: We ask at that you not bring children with you during ultrasound (echo/ vascular) testing. Due to room size and safety concerns, children are not allowed in the ultrasound rooms during exams. Our front office staff cannot provide observation of children in our lobby area while testing is being conducted. An adult accompanying a patient to their appointment will only be allowed in the ultrasound room at the discretion of the ultrasound technician under special circumstances. We apologize for any inconvenience.  ZIO XT- Long Term Monitor Instructions  Your physician has requested you wear a ZIO patch monitor for 14 days.  This is a single patch monitor. [employer] supplies one patch monitor per enrollment. Additional stickers are not available. Please do not apply patch if you will be having a Nuclear Stress Test,  Echocardiogram, Cardiac CT, MRI, or Chest Xray during the period you would be wearing the  monitor. The patch cannot be worn during these tests. You cannot remove and re-apply the  ZIO XT patch monitor.  Your ZIO patch monitor will be mailed 3 day USPS to your address on file. It may take 3-5 days   to receive your monitor after you have been enrolled.  Once you have received your monitor, please review the enclosed instructions. Your monitor  has already been registered assigning a specific monitor serial # to you.  Billing and Patient Assistance Program Information  We have supplied [employer] with any of your insurance information on file for billing purposes. [employer] offers a sliding scale Patient Assistance Program for patients that do not have  insurance, or whose insurance does not completely cover the cost of the ZIO monitor.  You must apply for the Patient Assistance Program to qualify for this discounted rate.  To apply, please call [employer] at [phone], select option 4, select option 2, ask to apply for  Patient Assistance Program. Meredeth will ask your household income, and how many people  are in your household. They will quote your out-of-pocket cost based on that information.  [employer] will also be able to set up a 41-month, interest-free payment plan if needed.  Applying the monitor   Shave hair from upper left chest.  Hold abrader disc by orange tab. Rub abrader in 40 strokes over the upper left chest as  indicated in your monitor instructions.  Clean area with 4 enclosed alcohol pads. Let dry.  Apply patch as indicated in monitor instructions. Patch will be placed under collarbone on left  side of chest with arrow pointing upward.  Rub patch adhesive wings for 2 minutes. Remove white label marked 1. Remove the white  label marked 2. Rub patch adhesive wings for 2 additional minutes.  While looking in a mirror, press and release button in center of patch. A small  green light will  flash 3-4 times. This will be your only indicator that the monitor has been turned on.  Do not shower for the first 24 hours. You may shower after the first 24 hours.  Press the button if you feel a symptom. You will hear a small click. Record Date, Time and  Symptom in the Patient  Logbook.  When you are ready to remove the patch, follow instructions on the last 2 pages of Patient  Logbook. Stick patch monitor onto the last page of Patient Logbook.  Place Patient Logbook in the blue and white box. Use locking tab on box and tape box closed  securely. The blue and white box has prepaid postage on it. Please place it in the mailbox as  soon as possible. Your physician should have your test results approximately 7 days after the  monitor has been mailed back to [facility].  Call [facility] Customer Care at [phone] if you have questions regarding  your ZIO XT patch monitor. Call them immediately if you see an orange light blinking on your  monitor.  If your monitor falls off in less than 4 days, contact our Monitor department at [phone].  If your monitor becomes loose or falls off after 4 days call [employer] at [phone] for  suggestions on securing your monitor   Follow-Up: At [facility], you and your health needs are our priority.  As part of our continuing mission to provide you with exceptional heart care, our providers are all part of one team.  This team includes your primary Cardiologist (physician) and Advanced Practice Providers or APPs (Physician Assistants and Nurse Practitioners) who all work together to provide you with the care you need, when you need it.  Your next appointment:   12 week(s)  Provider:   Kardie Tobb, DO

## 2023-11-15 NOTE — Progress Notes (Unsigned)
 Enrolled for [employer] to mail a ZIO XT long term holter monitor to the patients address on file.

## 2023-11-24 ENCOUNTER — Encounter: Payer: Self-pay | Admitting: Cardiology

## 2023-11-30 ENCOUNTER — Ambulatory Visit (INDEPENDENT_AMBULATORY_CARE_PROVIDER_SITE_OTHER): Admitting: Licensed Clinical Social Worker

## 2023-11-30 DIAGNOSIS — F4321 Adjustment disorder with depressed mood: Secondary | ICD-10-CM | POA: Diagnosis not present

## 2023-12-04 ENCOUNTER — Encounter (HOSPITAL_COMMUNITY): Payer: Self-pay | Admitting: Licensed Clinical Social Worker
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
## 2023-12-12 DIAGNOSIS — M25571 Pain in right ankle and joints of right foot: Secondary | ICD-10-CM | POA: Diagnosis not present

## 2023-12-13 ENCOUNTER — Other Ambulatory Visit: Payer: Self-pay

## 2023-12-13 MED ORDER — AMLODIPINE BESYLATE 5 MG PO TABS: 5.0000 mg | ORAL_TABLET | Freq: Every day | ORAL | 3 refills | Status: DC

## 2023-12-13 NOTE — Progress Notes (Signed)
 Amlodipine sent into preferred pharmacy.
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
## 2023-12-14 ENCOUNTER — Ambulatory Visit (HOSPITAL_BASED_OUTPATIENT_CLINIC_OR_DEPARTMENT_OTHER): Admitting: Student in an Organized Health Care Education/Training Program

## 2023-12-14 ENCOUNTER — Encounter (HOSPITAL_COMMUNITY): Payer: Self-pay | Admitting: Student in an Organized Health Care Education/Training Program

## 2023-12-14 VITALS — BP 176/99 | HR 105 | Ht 65.0 in | Wt 231.6 lb

## 2023-12-14 DIAGNOSIS — F331 Major depressive disorder, recurrent, moderate: Secondary | ICD-10-CM | POA: Diagnosis not present

## 2023-12-14 DIAGNOSIS — F411 Generalized anxiety disorder: Secondary | ICD-10-CM

## 2023-12-14 DIAGNOSIS — G4700 Insomnia, unspecified: Secondary | ICD-10-CM

## 2023-12-14 MED ORDER — TRAZODONE HCL 50 MG PO TABS: 50.0000 mg | ORAL_TABLET | Freq: Every day | ORAL | 2 refills | Status: DC

## 2023-12-14 MED ORDER — SERTRALINE HCL 100 MG PO TABS: 150.0000 mg | ORAL_TABLET | Freq: Every day | ORAL | 2 refills | Status: DC

## 2023-12-15 ENCOUNTER — Ambulatory Visit (INDEPENDENT_AMBULATORY_CARE_PROVIDER_SITE_OTHER): Admitting: Licensed Clinical Social Worker

## 2023-12-15 ENCOUNTER — Telehealth: Payer: Self-pay

## 2023-12-15 DIAGNOSIS — F331 Major depressive disorder, recurrent, moderate: Secondary | ICD-10-CM | POA: Diagnosis not present

## 2023-12-15 DIAGNOSIS — R002 Palpitations: Secondary | ICD-10-CM

## 2023-12-15 NOTE — Telephone Encounter (Signed)
 Called pt. Pt will start Propranolol  this weekend (she is not home). Pt will monitor blood pressure at home and report back in 2 weeks with readings. Pt will hold off on starting Amlodipine at this time.

## 2023-12-16 ENCOUNTER — Ambulatory Visit: Payer: Self-pay | Admitting: *Deleted

## 2023-12-20 ENCOUNTER — Ambulatory Visit (HOSPITAL_COMMUNITY)
Admission: RE | Admit: 2023-12-20 | Discharge: 2023-12-20 | Disposition: A | Discharge status: Home / Self Care | Source: Ambulatory Visit | Attending: Cardiology | Admitting: Cardiology

## 2023-12-20 DIAGNOSIS — R0609 Other forms of dyspnea: Secondary | ICD-10-CM | POA: Diagnosis not present

## 2023-12-20 LAB — ECHOCARDIOGRAM COMPLETE
Area-P 1/2: 4.8 cm2
S' Lateral: 3 cm

## 2023-12-22 ENCOUNTER — Encounter: Payer: Self-pay | Admitting: Cardiology

## 2023-12-22 ENCOUNTER — Ambulatory Visit: Attending: Cardiology | Admitting: Cardiology

## 2023-12-22 ENCOUNTER — Encounter (HOSPITAL_COMMUNITY): Payer: Self-pay | Admitting: Licensed Clinical Social Worker

## 2023-12-22 VITALS — BP 122/82 | HR 88 | Ht 65.0 in | Wt 234.9 lb

## 2023-12-22 DIAGNOSIS — I4711 Inappropriate sinus tachycardia, so stated: Secondary | ICD-10-CM | POA: Insufficient documentation

## 2023-12-22 DIAGNOSIS — I471 Supraventricular tachycardia, unspecified: Secondary | ICD-10-CM | POA: Diagnosis not present

## 2023-12-22 NOTE — Progress Notes (Signed)
 Virtual Visit via Video Note  I connected with Kayla Nguyen on 12/15/23 at  1:30 PM EDT by a video enabled telemedicine application and verified that I am speaking with the correct person using two identifiers.  Location: Patient: car Provider: home office   I discussed the limitations of evaluation and management by telemedicine and the availability of in person appointments. The patient expressed understanding and agreed to proceed.   I discussed the assessment and treatment plan with the patient. The patient was provided an opportunity to ask questions and all were answered. The patient agreed with the plan and demonstrated an understanding of the instructions.   The patient was advised to call back or seek an in-person evaluation if the symptoms worsen or if the condition fails to improve as anticipated.  I provided 45 minutes of non-face-to-face time during this encounter.   Kayla JONELLE Rosser, LCSW   THERAPIST PROGRESS NOTE  Session Time: 1:30pm-2:15pm  Participation Level: Active  Behavioral Response: NeatAlertEuthymic  Type of Therapy: Individual Therapy  Treatment Goals addressed: : "I just need someone to talk to about my family situation, get some advice, and accept me for who I am. Currently feeling depressed almost every day. Current goal will be to report depression less than 5 days per week.   ProgressTowards Goals: Progressing  Interventions: CBT  Summary: Kayla Nguyen is a 23 y.o. female who presents with MDD, recurrent, moderate  Suicidal/Homicidal: Nowithout intent/plan  Therapist Response: Kayla Nguyen well in individual virtual session with clinician. Clinician utilized CBT to process thoughts, feelings, and behaviors. Clinician explored updates on relationships and noted continuing closeness with sister. Kayla Nguyen shared concerns and frustration with mother, who is not giving Kayla Nguyen much time for a relationship. Clinician provided time and space for  Kayla Nguyen to share thoughts and feelings about mom's relationship with a man. Clinician utilized reality testing about what mom's responsibility is at this point in Kayla Nguyen's life. Clinician provided options for communicating these feelings to mom. Clinician also noted the importance of acceptance.   Plan: Return again in 2 weeks.  Diagnosis: Moderate episode of recurrent major depressive disorder (HCC)  Collaboration of Care: Medication Management AEB reviewed note from med provider  Patient/Guardian was advised Release of Information must be obtained prior to any record release in order to collaborate their care with an outside provider. Patient/Guardian was advised if they have not already done so to contact the registration department to sign all necessary forms in order for us  to release information regarding their care.   Consent: Patient/Guardian gives verbal consent for treatment and assignment of benefits for services provided during this visit. Patient/Guardian expressed understanding and agreed to proceed.   Kayla JONELLE Soudan, LCSW 12/22/2023

## 2023-12-22 NOTE — Patient Instructions (Signed)
 Medication Instructions:  Your physician has recommended you make the following change in your medication:  STOP: Amlodipine *If you need a refill on your cardiac medications before your next appointment, please call your pharmacy*   Follow-Up: At [facility], you and your health needs are our priority.  As part of our continuing mission to provide you with exceptional heart care, our providers are all part of one team.  This team includes your primary Cardiologist (physician) and Advanced Practice Providers or APPs (Physician Assistants and Nurse Practitioners) who all work together to provide you with the care you need, when you need it.  Your next appointment:   6 month(s)  Provider:   Kardie Tobb, DO

## 2023-12-28 NOTE — Progress Notes (Signed)
 Cardiology Office Note:    Date:  12/28/2023   ID:  Kayla Nguyen, DOB 01/09/2001, MRN [id number]  PCP:  Kayla Joesph LABOR, PA  Cardiologist:  Dub Huntsman, DO  Electrophysiologist:  None   Referring MD: Kayla Joesph LABOR, PA    I am ok, but have has some symptoms  History of Present Illness:    Kayla Nguyen is a 23 y.o. female with hx of heart palpitations with symptoms suspected to be inappropriate sinus tachycardia.   I saw her initially 11/15/2023 at that time I placed the monitor on her and got an echo. She is here with her mother and sister to discuss test results.   She reports that she has been experiencing daily episodes of elevated heart rate, reaching 170-180 bpm, predominantly sinus tachycardia. Symptoms occur regardless of activity level, including while sitting, and are accompanied by shortness of breath and headache. Her heart rate monitor showed one episode of supraventricular tachycardia and frequent episodes of elevated heart rate.  An echocardiogram was performed recently. She has not started propranolol , which was previously discussed as a treatment option.  She is currently taking sertraline  150 mg for anxiety.  Past Medical History:  Diagnosis Date   Seizure-like activity (HCC) 06/30/2023    Past Surgical History:  Procedure Laterality Date   ANKLE FRACTURE SURGERY Right 09/21/2023    Current Medications: Current Meds  Medication Sig   Ferrous Sulfate (IRON PO) Take by mouth daily.   propranolol  (INDERAL ) 10 MG tablet Take 1 tablet (10 mg total) by mouth at bedtime.   sertraline  (ZOLOFT ) 100 MG tablet Take 1.5 tablets (150 mg total) by mouth daily.   traZODone  (DESYREL ) 50 MG tablet Take 1 tablet (50 mg total) by mouth at bedtime.   [DISCONTINUED] amLODipine (NORVASC) 5 MG tablet Take 1 tablet (5 mg total) by mouth daily.   Current Facility-Administered Medications for the 12/22/23 encounter (Office Visit) with Adleigh Mcmasters, DO  Medication    lidocaine  (PF) (XYLOCAINE ) 1 % injection 3 mL     Allergies:   Patient has no known allergies.   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, on light duty    Social Drivers of Corporate Investment Banker Strain: Low Risk  (07/12/2023)   Overall Financial Resource Strain (CARDIA)    Difficulty of Paying Living Expenses: Not very hard  Food Insecurity: No Food Insecurity (07/12/2023)   Hunger Vital Sign    Worried About Running Out of Food in the Last Year: Never true    Ran Out of Food in the Last Year: Never true  Transportation Needs: No Transportation Needs (07/12/2023)   PRAPARE - Administrator, Civil Service (Medical): No    Lack of Transportation (Non-Medical): No  Physical Activity:  Insufficiently Active (07/12/2023)   Exercise Vital Sign    Days of Exercise per Week: 3 days    Minutes of Exercise per Session: 20 min  Stress: No Stress Concern Present (07/12/2023)   [employer] of Occupational Health - Occupational Stress Questionnaire    Feeling of Stress : Only a little  Social Connections: Moderately Isolated (07/12/2023)   Social Connection and Isolation Panel    Frequency of Communication with Friends and Family: More than three times a week    Frequency of Social Gatherings with Friends and Family: Twice a week    Attends Religious Services: 1 to 4 times per year    Active  Member of [employer] or Organizations: No    Attends Engineer, Structural: Not on file    Marital Status: Never married     Family History: The patient's family history includes Bladder Cancer in her father; Diabetes in her father, paternal grandfather, and sister; Polycythemia in her mother; Prostate cancer in her father.  ROS:   Review of Systems  Constitution: Negative for decreased appetite, fever and weight gain.  HENT: Negative for congestion, ear discharge, hoarse voice and sore throat.   Eyes: Negative for discharge, redness, vision loss in right eye and visual halos.  Cardiovascular: Negative for chest pain, dyspnea on exertion, leg swelling, orthopnea and palpitations.  Respiratory: Negative for cough, hemoptysis, shortness of breath and snoring.   Endocrine: Negative for heat intolerance and polyphagia.  Hematologic/Lymphatic: Negative for bleeding problem. Does not bruise/bleed easily.  Skin: Negative for flushing, nail changes, rash and suspicious lesions.  Musculoskeletal: Negative for arthritis, joint pain, muscle cramps, myalgias, neck pain and stiffness.  Gastrointestinal: Negative for abdominal pain, bowel incontinence, diarrhea and excessive appetite.  Genitourinary: Negative for decreased libido, genital sores and incomplete emptying.  Neurological: Negative for brief paralysis, focal weakness, headaches and loss of balance.  Psychiatric/Behavioral: Negative for altered mental status, depression and suicidal ideas.  Allergic/Immunologic: Negative for HIV exposure and persistent infections.    EKGs/Labs/Other Studies Reviewed:    The following studies were reviewed today:   EKG:  None today   Recent Labs: 07/01/2023: Magnesium 2.0; TSH 4.035 08/24/2023: ALT 15; BUN 8; Creatinine 0.66; Hemoglobin 13.8; Platelet Count 348; Potassium 3.7; Sodium 138  Recent Lipid Panel    Component Value Date/Time   CHOL 184 02/22/2023 0904   TRIG 104 02/22/2023 0904   HDL 45  02/22/2023 0904   CHOLHDL 4.1 02/22/2023 0904   LDLCALC 120 (H) 02/22/2023 0904    Physical Exam:    VS:  BP 122/82 (BP Location: Right Arm, Patient Position: Sitting, Cuff Size: Normal)   Pulse 88   Ht 5' 5 (1.651 m)   Wt 234 lb 14.4 oz (106.5 kg)   SpO2 98%   BMI 39.09 kg/m     Wt Readings from Last 3 Encounters:  12/22/23 234 lb 14.4 oz (106.5 kg)  11/15/23 220 lb 9.6 oz (100.1 kg)  10/17/23 220 lb (99.8 kg)     GEN: Well nourished, well developed in no acute distress HEENT: Normal NECK: No JVD; No carotid bruits LYMPHATICS: No lymphadenopathy CARDIAC: S1S2 noted,RRR, no murmurs, rubs, gallops RESPIRATORY:  Clear to auscultation without rales, wheezing or rhonchi  ABDOMEN: Soft, non-tender, non-distended, +bowel sounds, no guarding. EXTREMITIES: No edema, No cyanosis, no clubbing MUSCULOSKELETAL:  No deformity  SKIN: Warm and dry NEUROLOGIC:  Alert and oriented x 3, non-focal PSYCHIATRIC:  Normal affect, good insight  ASSESSMENT:    1. Inappropriate  sinus tachycardia   2. PSVT (paroxysmal supraventricular tachycardia)   3. Morbid obesity (HCC)    PLAN:    Inappropriate sinus tachycardia with dysautonomia Echocardiogram showed normal cardiac function, no heart wall thickening, and normal diastolic function. Monitor report indicated one episode of SVT, with most episodes consistent with sinus tachycardia, aligning with inappropriate sinus tachycardia given reported symptoms- Characterized by elevated heart rates at rest and during minimal activity, reaching 130 bpm. - Prescribed propranolol  10 mg as needed, up to every 8 hours, with at least 6 ounces of water. - Encouraged continuation of physical therapy to prevent deconditioning. - Advised on lifestyle modifications, including reducing caffeine and sugar intake.  Rare paroxysmal supraventricular tachycardia (SVT) Monitor report indicated one episode of SVT, not considered the primary issue. Single episode does not  warrant ablation, as inappropriate sinus tachycardia is the primary concern. - Documented rare paroxysmal SVT in the chart for future reference.  Anxiety disorder Currently on sertraline  150 mg. Propranolol  could synergistically work with sertraline  to manage anxiety symptoms by reducing physical symptoms such as elevated heart rate. - Continue sertraline  150 mg. - Consider propranolol  as needed for anxiety-related symptoms.   The patient is in agreement with the above plan. The patient left the office in stable condition.  The patient will follow up in   Medication Adjustments/Labs and Tests Ordered: Current medicines are reviewed at length with the patient today.  Concerns regarding medicines are outlined above.  No orders of the defined types were placed in this encounter.  No orders of the defined types were placed in this encounter.   Patient Instructions  Medication Instructions:  Your physician has recommended you make the following change in your medication:  STOP: Amlodipine *If you need a refill on your cardiac medications before your next appointment, please call your pharmacy*   Follow-Up: At [facility], you and your health needs are our priority.  As part of our continuing mission to provide you with exceptional heart care, our providers are all part of one team.  This team includes your primary Cardiologist (physician) and Advanced Practice Providers or APPs (Physician Assistants and Nurse Practitioners) who all work together to provide you with the care you need, when you need it.  Your next appointment:   6 month(s)  Provider:   Billyjoe Go, DO      Adopting a Healthy Lifestyle.  Know what a healthy weight is for you (roughly BMI <25) and aim to maintain this   Aim for 7+ servings of fruits and vegetables daily   65-80+ fluid ounces of water or unsweet tea for healthy kidneys   Limit to max 1 drink of alcohol per day; avoid smoking/tobacco    Limit animal fats in diet for cholesterol and heart health - choose grass fed whenever available   Avoid highly processed foods, and foods high in saturated/trans fats   Aim for low stress - take time to unwind and care for your mental health   Aim for 150 min of moderate intensity exercise weekly for heart health, and weights twice weekly for bone health   Aim for 7-9 hours of sleep daily   When it comes to diets, agreement about the perfect plan isnt easy to find, even among the experts. Experts at the [facility] of [employer] developed an idea known as the Healthy Eating Plate. Just imagine a plate divided into logical, healthy portions.   The emphasis is on diet quality:   Load up on  vegetables and fruits - one-half of your plate: Aim for color and variety, and remember that potatoes dont count.   Go for whole grains - one-quarter of your plate: Whole wheat, barley, wheat berries, quinoa, oats, brown rice, and foods made with them. If you want pasta, go with whole wheat pasta.   Protein power - one-quarter of your plate: Fish, chicken, beans, and nuts are all healthy, versatile protein sources. Limit red meat.   The diet, however, does go beyond the plate, offering a few other suggestions.   Use healthy plant oils, such as olive, canola, soy, corn, sunflower and peanut. Check the labels, and avoid partially hydrogenated oil, which have unhealthy trans fats.   If youre thirsty, drink water. Coffee and tea are good in moderation, but skip sugary drinks and limit milk and dairy products to one or two daily servings.   The type of carbohydrate in the diet is more important than the amount. Some sources of carbohydrates, such as vegetables, fruits, whole grains, and beans-are healthier than others.   Finally, stay active  Signed, Albertus Chiarelli, DO  12/28/2023 4:16 PM    [facility]

## 2023-12-29 ENCOUNTER — Encounter (HOSPITAL_COMMUNITY): Payer: Self-pay | Admitting: Licensed Clinical Social Worker

## 2023-12-29 ENCOUNTER — Ambulatory Visit (INDEPENDENT_AMBULATORY_CARE_PROVIDER_SITE_OTHER): Admitting: Licensed Clinical Social Worker

## 2023-12-29 DIAGNOSIS — F331 Major depressive disorder, recurrent, moderate: Secondary | ICD-10-CM | POA: Diagnosis not present

## 2023-12-29 NOTE — Progress Notes (Signed)
 Virtual Visit via Video Note  I connected with Gaige Fussner on 12/29/23 at  1:30 PM EST by a video enabled telemedicine application and verified that I am speaking with the correct person using two identifiers.  Location: Patient: home Provider: home office   I discussed the limitations of evaluation and management by telemedicine and the availability of in person appointments. The patient expressed understanding and agreed to proceed.    I discussed the assessment and treatment plan with the patient. The patient was provided an opportunity to ask questions and all were answered. The patient agreed with the plan and demonstrated an understanding of the instructions.   The patient was advised to call back or seek an in-person evaluation if the symptoms worsen or if the condition fails to improve as anticipated.  I provided 55 minutes of non-face-to-face time during this encounter.   [name], LCSW   THERAPIST PROGRESS NOTE  Session Time: 1:30pm-2:25pm  Participation Level: Active  Behavioral Response: NeatAlertDepressed  Type of Therapy: Individual Therapy  Treatment Goals addressed:  "I just need someone to talk to about my family situation, get some advice, and accept me for who I am. Currently feeling depressed almost every day. Current goal will be to report depression less than 5 days per week.   ProgressTowards Goals: Progressing  Interventions: CBT  Summary: Ouita Nish is a 23 y.o. female who presents with MDD, recurrent, moderate.   Suicidal/Homicidal: Nowithout intent/plan  Therapist Response: Emmerie engaged well in individual virtual session with facilities manager. Clinician utilized CBT to process thoughts, feelings and interactions. Clinician processed recent interactions with mother and noted challenges based on mother and family's views on interracial relationships. Clinician identified the difficulty to respect this viewpoint based on mother's own  indiscretions. Clinician reflected the importance of Sira's happiness and comfort in a relationship, whoever she chooses. Clinician explored coping skills. Clinician explored relationship with sister and noted significant dependence on sister for friendship, support, and love. Clinician identified the challenges of expecting so much from sister that she gets disappointed or hurt feelings when sister is not attending to her as much as she wants. Clinician identified the importance of adjusting her expectations to meet what is realistic. Clinician also discussed options of making new friends or re-establishing healthy old friendships.   Plan: Return again in 2 weeks.  Diagnosis: Moderate episode of recurrent major depressive disorder (HCC)  Collaboration of Care: Medication Management AEB Informed nurse that Marrissa has been having very intense and vivid dreams since increasing Zoloft  and starting Trazodone    Patient/Guardian was advised Release of Information must be obtained prior to any record release in order to collaborate their care with an outside provider. Patient/Guardian was advised if they have not already done so to contact the registration department to sign all necessary forms in order for us  to release information regarding their care.   Consent: Patient/Guardian gives verbal consent for treatment and assignment of benefits for services provided during this visit. Patient/Guardian expressed understanding and agreed to proceed.   Harlene JONELLE Ravalli, LCSW 12/29/2023

## 2023-12-30 ENCOUNTER — Telehealth (HOSPITAL_COMMUNITY): Payer: Self-pay | Admitting: Student in an Organized Health Care Education/Training Program

## 2023-12-30 NOTE — Telephone Encounter (Addendum)
 Contacted and spoke with patient at her mobile number.  She describes onset of vivid and intense dreams since Zoloft  was titrated and trazodone  was started.  She denies experiencing trauma associated nightmares or nightmares in general.  She finds that the trazodone  has been helpful in keeping her asleep.  She reports the titration of Zoloft  to 150 mg has significantly improved her mood and irritability.  The patient wishes to remain on the current psychiatric regiment.  She was advised that if she starts having nightmares, to reduce trazodone  to 25 mg to see if they resolve or discontinue completely.  Discussed with patient that these dreams may either be related to her trazodone  (although it is more likely to see nightmares with this medication) or resolution of depressive symptoms leading to return of REM sleep.  No acute concerns at this time.  Patient is scheduled to follow-up with me on 01/23/2024  [facility], MD PGY-3, [facility]

## 2024-01-03 ENCOUNTER — Ambulatory Visit (INDEPENDENT_AMBULATORY_CARE_PROVIDER_SITE_OTHER)

## 2024-01-03 VITALS — BP 104/70 | HR 73 | Temp 98.3°F | Ht 65.0 in | Wt 230.1 lb

## 2024-01-03 DIAGNOSIS — F321 Major depressive disorder, single episode, moderate: Secondary | ICD-10-CM

## 2024-01-03 DIAGNOSIS — E559 Vitamin D deficiency, unspecified: Secondary | ICD-10-CM | POA: Diagnosis not present

## 2024-01-03 DIAGNOSIS — E66812 Obesity, class 2: Secondary | ICD-10-CM | POA: Diagnosis not present

## 2024-01-03 DIAGNOSIS — Z6838 Body mass index (BMI) 38.0-38.9, adult: Secondary | ICD-10-CM

## 2024-01-03 DIAGNOSIS — R569 Unspecified convulsions: Secondary | ICD-10-CM

## 2024-01-03 DIAGNOSIS — I4711 Inappropriate sinus tachycardia, so stated: Secondary | ICD-10-CM | POA: Insufficient documentation

## 2024-01-03 NOTE — Assessment & Plan Note (Signed)
 Diagnosed by cardiology with 1 short episode of PSVT on monitor.  Is currently doing propranolol  10 mg as needed as prescribed by cardiology.  Continue regular follow-ups with cardiology per their recommendations.

## 2024-01-03 NOTE — Assessment & Plan Note (Signed)
 Weight management options discussed. Phentermine and Wellbutrin contraindicated due to her diagnosis of inappropriate sinus tachycardia/PSVT and current workup for seizure-like activity. GLP-1 agonists not covered by Medicaid without type 2 diabetes or sleep apnea diagnosis. - Ordered A1c to assess for diabetes. - Consider referral to [facility] for weight management.

## 2024-01-03 NOTE — Progress Notes (Signed)
 Established Patient Office Visit  Subjective   Patient ID: Kayla Nguyen, female    DOB: 12-29-2000  Age: 23 y.o. MRN: [id number]  Chief Complaint  Patient presents with   New Patient (Initial Visit)    Transfer of Care    HPI  History of Present Illness   Kayla Nguyen is a 23 year old female with PSVT and inappropriate tachycardia who presents for transfer of care and medication review.  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 - Under care of a neurologist - Three-day EEG scheduled for further evaluation  Sleep disturbance - Trazodone  used for sleep with significant improvement in sleep quality  Mood disorder - Sertraline  150 mg daily prescribed by psychiatrist for mental health - Stable on current regimen  Iron deficiency and hair loss - History of low iron level (8) - Evaluated by hematologist for body pain and low iron and due to family history of polycythemia vera and her mother - Takes iron supplements, prefers gummies due to difficulty swallowing pills - Currently out of iron gummies, hair loss has recurred  Contraception - Nexplanon  implant in place for 2-3 months after discontinuing Depo-Provera   Gynecologic and immunization status - Up to date on Pap smear and vaccinations except for influenza vaccine, which has not been received  Weight management and metabolic risk - Expresses concern about weight management - History of borderline prediabetes - Family history of type 2 diabetes - Previously lost weight with [employer], but has regained weight          ROS Per HPI.    Objective:     BP 104/70   Pulse 73   Temp  98.3 F (36.8 C) (Oral)   Ht 5' 5 (1.651 m)   Wt 230 lb 1.3 oz (104.4 kg)   SpO2 99%   BMI 38.29 kg/m    Physical Exam Constitutional:      General: She is not in acute distress.    Appearance: Normal appearance.  Cardiovascular:     Rate and Rhythm: Normal rate and regular rhythm.     Heart sounds: Normal heart sounds. No murmur heard.    No friction rub. No gallop.  Pulmonary:     Effort: Pulmonary effort is normal. No respiratory distress.     Breath sounds: Normal breath sounds.  Abdominal:     General: Bowel sounds are normal.  Musculoskeletal:        General: No swelling.     Cervical back: Neck supple.  Lymphadenopathy:     Cervical: No cervical adenopathy.  Skin:    General: Skin is warm and dry.  Neurological:     General: No focal deficit present.     Mental Status: She is alert.  Psychiatric:        Mood and Affect: Mood normal.        Behavior: Behavior normal.        Thought Content: Thought content normal.      No results found for any visits on 01/03/24.    The ASCVD Risk score (Arnett DK, et al., 2019) failed to calculate for the following reasons:   The 2019 ASCVD risk score is only valid for ages 59 to 7  Assessment & Plan:   Vitamin D  deficiency -     VITAMIN D  25 Hydroxy (Vit-D Deficiency, Fractures)  Class 2 obesity without serious comorbidity with body mass index (BMI) of 38.0 to 38.9 in adult, unspecified obesity type Assessment & Plan: Weight management options discussed. Phentermine and Wellbutrin contraindicated due to her diagnosis of inappropriate sinus tachycardia/PSVT and current workup for seizure-like activity. GLP-1 agonists not covered by Medicaid without type 2 diabetes or sleep apnea diagnosis. - Ordered A1c to assess for diabetes. - Consider referral to [facility] for weight management.  Orders: -     VITAMIN D  25 Hydroxy (Vit-D Deficiency, Fractures) -     TSH -     Hemoglobin A1c -     Lipid  panel -     Comprehensive metabolic panel with GFR -     CBC with Differential/Platelet  Major depressive disorder, single episode, moderate (HCC) Assessment & Plan: Managed with sertraline  150 mg and trazodone . Reports improved sleep. - Continue sertraline  150 mg daily. - Continue trazodone  as prescribed. - Continue regular follow-ups with psychiatry as scheduled   Seizure-like activity [facility]) Assessment & Plan: Under neurologist care with upcoming three-day EEG. - Proceed with scheduled three-day EEG.   Inappropriate sinus tachycardia Assessment & Plan: Diagnosed by cardiology with 1 short episode of PSVT on monitor.  Is currently doing propranolol  10 mg as needed as prescribed by cardiology.  Continue regular follow-ups with cardiology per their recommendations.     Return in about 3 months (around 04/04/2024) for Physical.    Kayla JULIANNA Sacks, PA-C

## 2024-01-03 NOTE — Assessment & Plan Note (Signed)
 Managed with sertraline  150 mg and trazodone . Reports improved sleep. - Continue sertraline  150 mg daily. - Continue trazodone  as prescribed. - Continue regular follow-ups with psychiatry as scheduled

## 2024-01-03 NOTE — Patient Instructions (Signed)
 VISIT SUMMARY: Today, you had a routine wellness visit where we reviewed your current health conditions and medications. We discussed your weight management concerns, and I ordered an A1c test to check for diabetes. We also talked about your heart condition, non-epileptic seizures, mood disorder, sleep issues, and iron deficiency.  YOUR PLAN: OBESITY: You are concerned about your weight, and your current weight is the highest it has been. -I have ordered an A1c test to check for diabetes. -Consider a referral to [facility] for weight management.  PAROXYSMAL SUPRAVENTRICULAR TACHYCARDIA AND INAPPROPRIATE SINUS TACHYCARDIA: You have been diagnosed with PSVT and inappropriate sinus tachycardia. -Continue taking propranolol  10 mg as needed.  NON-EPILEPTIC SEIZURES: You have a history of non-epileptic seizures and have an upcoming three-day EEG scheduled. -Proceed with the scheduled three-day EEG.  DEPRESSION AND INSOMNIA: You are currently taking sertraline  for depression and trazodone  for sleep, both of which are helping. -Continue taking sertraline  150 mg daily. -Continue taking trazodone  as prescribed.  IRON DEFICIENCY: You have a history of low iron levels and hair loss, which may be related. -Restart taking iron supplements in gummy form.  If you have any problems before your next visit feel free to message me via MyChart (minor issues or questions) or call the office, otherwise you may reach out to schedule an office visit.  Thank you! Saddie Sacks, PA-C

## 2024-01-03 NOTE — Assessment & Plan Note (Signed)
 Under neurologist care with upcoming three-day EEG. - Proceed with scheduled three-day EEG.

## 2024-01-04 ENCOUNTER — Ambulatory Visit: Payer: Self-pay

## 2024-01-04 LAB — CBC WITH DIFFERENTIAL/PLATELET
Basophils Absolute: 0.1 x10E3/uL (ref 0.0–0.2)
Basos: 1 %
EOS (ABSOLUTE): 0.1 x10E3/uL (ref 0.0–0.4)
Eos: 2 %
Hematocrit: 44.4 % (ref 34.0–46.6)
Hemoglobin: 14.1 g/dL (ref 11.1–15.9)
Immature Grans (Abs): 0 x10E3/uL (ref 0.0–0.1)
Immature Granulocytes: 0 %
Lymphocytes Absolute: 2.5 x10E3/uL (ref 0.7–3.1)
Lymphs: 34 %
MCH: 27.8 pg (ref 26.6–33.0)
MCHC: 31.8 g/dL (ref 31.5–35.7)
MCV: 88 fL (ref 79–97)
Monocytes Absolute: 0.5 x10E3/uL (ref 0.1–0.9)
Monocytes: 6 %
Neutrophils Absolute: 4.2 x10E3/uL (ref 1.4–7.0)
Neutrophils: 57 %
Platelets: 398 x10E3/uL (ref 150–450)
RBC: 5.07 x10E6/uL (ref 3.77–5.28)
RDW: 12.9 % (ref 11.7–15.4)
WBC: 7.4 x10E3/uL (ref 3.4–10.8)

## 2024-01-04 LAB — LIPID PANEL
Chol/HDL Ratio: 4 ratio (ref 0.0–4.4)
Cholesterol, Total: 187 mg/dL (ref 100–199)
HDL: 47 mg/dL (ref >39)
LDL Chol Calc (NIH): 116 mg/dL — ABNORMAL HIGH (ref 0–99)
Triglycerides: 137 mg/dL (ref 0–149)
VLDL Cholesterol Cal: 24 mg/dL (ref 5–40)

## 2024-01-04 LAB — COMPREHENSIVE METABOLIC PANEL WITH GFR
ALT: 12 IU/L (ref 0–32)
AST: 14 IU/L (ref 0–40)
Albumin: 4.5 g/dL (ref 4.0–5.0)
Alkaline Phosphatase: 112 IU/L (ref 41–116)
BUN/Creatinine Ratio: 13 (ref 9–23)
BUN: 9 mg/dL (ref 6–20)
Bilirubin Total: 0.3 mg/dL (ref 0.0–1.2)
CO2: 23 mmol/L (ref 20–29)
Calcium: 9.6 mg/dL (ref 8.7–10.2)
Chloride: 101 mmol/L (ref 96–106)
Creatinine, Ser: 0.68 mg/dL (ref 0.57–1.00)
Globulin, Total: 2.8 g/dL (ref 1.5–4.5)
Glucose: 70 mg/dL (ref 70–99)
Potassium: 4.5 mmol/L (ref 3.5–5.2)
Sodium: 140 mmol/L (ref 134–144)
Total Protein: 7.3 g/dL (ref 6.0–8.5)
eGFR: 125 mL/min/1.73 (ref >59)

## 2024-01-04 LAB — TSH: TSH: 2.7 u[IU]/mL (ref 0.450–4.500)

## 2024-01-04 LAB — HEMOGLOBIN A1C
Est. average glucose Bld gHb Est-mCnc: 105 mg/dL
Hgb A1c MFr Bld: 5.3 % (ref 4.8–5.6)

## 2024-01-04 LAB — VITAMIN D 25 HYDROXY (VIT D DEFICIENCY, FRACTURES): Vit D, 25-Hydroxy: 15.8 ng/mL — ABNORMAL LOW (ref 30.0–100.0)

## 2024-01-04 MED ORDER — CHOLECALCIFEROL 1.25 MG (50000 UT) PO TABS: 50000.0000 [IU] | ORAL_TABLET | ORAL | 0 refills | Status: AC

## 2024-01-10 ENCOUNTER — Other Ambulatory Visit: Payer: Self-pay | Admitting: Family Medicine

## 2024-01-10 DIAGNOSIS — R790 Abnormal level of blood mineral: Secondary | ICD-10-CM

## 2024-01-12 ENCOUNTER — Ambulatory Visit (INDEPENDENT_AMBULATORY_CARE_PROVIDER_SITE_OTHER): Admitting: Licensed Clinical Social Worker

## 2024-01-12 ENCOUNTER — Encounter (HOSPITAL_COMMUNITY): Payer: Self-pay

## 2024-01-12 ENCOUNTER — Encounter (HOSPITAL_COMMUNITY): Payer: Self-pay | Admitting: Licensed Clinical Social Worker

## 2024-01-12 DIAGNOSIS — F331 Major depressive disorder, recurrent, moderate: Secondary | ICD-10-CM | POA: Diagnosis not present

## 2024-01-12 DIAGNOSIS — F4321 Adjustment disorder with depressed mood: Secondary | ICD-10-CM | POA: Diagnosis not present

## 2024-01-12 NOTE — Progress Notes (Signed)
 Virtual Visit via Video Note  I connected with Kayla Nguyen on 01/12/24 at  1:30 PM EST by a video enabled telemedicine application and verified that I am speaking with the correct person using two identifiers.  Location: Patient: home Provider: home office   I discussed the limitations of evaluation and management by telemedicine and the availability of in person appointments. The patient expressed understanding and agreed to proceed.   I discussed the assessment and treatment plan with the patient. The patient was provided an opportunity to ask questions and all were answered. The patient agreed with the plan and demonstrated an understanding of the instructions.   The patient was advised to call back or seek an in-person evaluation if the symptoms worsen or if the condition fails to improve as anticipated.  I provided 45 minutes of non-face-to-face time during this encounter.   Kayla JONELLE Rosser, LCSW   THERAPIST PROGRESS NOTE  Session Time: 1:30pm-2:15pm  Participation Level: Active  Behavioral Response: Well GroomedAlertsad  Type of Therapy: Individual Therapy  Treatment Goals addressed:  Problem: OP Depression     Dates: Start:  01/12/24       Disciplines: Interdisciplinary, PROVIDER        Goal: LTG: Reduce frequency, intensity, and duration of depression symptoms so that daily functioning is improved     Dates: Start:  01/12/24    Expected End:  01/10/25       Disciplines: Interdisciplinary, PROVIDER             Goal: LTG: Increase coping skills to manage depression and improve ability to perform daily activities     Dates: Start:  01/12/24    Expected End:  01/10/25       Disciplines: Interdisciplinary, PROVIDER        ProgressTowards Goals: Progressing  Interventions: CBT  Summary: Kayla Nguyen is a 23 y.o. female who presents with MDD, recurrent, moderate and grief.   Suicidal/Homicidal: Nowithout intent/plan  Therapist Response: Kayla Nguyen engaged  well in individual virtual session with facilities manager. Clinician utilized CBT to process thoughts, feelings, and behaviors. Clinician explored current mood and processed how this season is difficult because of family stresses, loss of dad, and thinking back over past relationship. Clinician provided psychoeducation about grief and noted the back and forth nature of grieving. Clinician also discussed Kayla Nguyen's tendency to have more patience with herself about grieving father's death over the break up with her fiancee. Clinician normalized this tendency, but also noted that the feelings of loss and possibly abandonment are the same and can be accepted as a part of life. Clinician explored future planning and goals. Clinician provided feedback and encouragement to look at going back to school. Clinician provided website [employer] for career inventory.   Plan: Return again in 2-4 weeks.  Diagnosis: Moderate episode of recurrent major depressive disorder (HCC)  Grief  Collaboration of Care: Patient refused AEB none required  Patient/Guardian was advised Release of Information must be obtained prior to any record release in order to collaborate their care with an outside provider. Patient/Guardian was advised if they have not already done so to contact the registration department to sign all necessary forms in order for us  to release information regarding their care.   Consent: Patient/Guardian gives verbal consent for treatment and assignment of benefits for services provided during this visit. Patient/Guardian expressed understanding and agreed to proceed.   Kayla JONELLE Kirvin, LCSW 01/12/2024
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
## 2024-01-23 ENCOUNTER — Ambulatory Visit (HOSPITAL_COMMUNITY): Admitting: Student in an Organized Health Care Education/Training Program

## 2024-01-23 DIAGNOSIS — G4700 Insomnia, unspecified: Secondary | ICD-10-CM | POA: Diagnosis not present

## 2024-01-23 DIAGNOSIS — Z87898 Personal history of other specified conditions: Secondary | ICD-10-CM

## 2024-01-23 DIAGNOSIS — F331 Major depressive disorder, recurrent, moderate: Secondary | ICD-10-CM | POA: Diagnosis not present

## 2024-01-23 DIAGNOSIS — F411 Generalized anxiety disorder: Secondary | ICD-10-CM

## 2024-01-23 MED ORDER — TRAZODONE HCL 50 MG PO TABS: 50.0000 mg | ORAL_TABLET | Freq: Every day | ORAL | 2 refills | Status: DC

## 2024-01-23 MED ORDER — SERTRALINE HCL 100 MG PO TABS: 200.0000 mg | ORAL_TABLET | Freq: Every day | ORAL | 2 refills | Status: DC

## 2024-01-23 MED ORDER — MELATONIN 5 MG PO CAPS: 5.0000 mg | ORAL_CAPSULE | Freq: Every evening | ORAL | Status: DC | PRN

## 2024-01-25 ENCOUNTER — Encounter: Payer: Self-pay | Admitting: Cardiology

## 2024-01-25 DIAGNOSIS — M25371 Other instability, right ankle: Secondary | ICD-10-CM | POA: Diagnosis not present

## 2024-01-25 DIAGNOSIS — M25571 Pain in right ankle and joints of right foot: Secondary | ICD-10-CM | POA: Diagnosis not present

## 2024-01-26 ENCOUNTER — Encounter (HOSPITAL_COMMUNITY): Payer: Self-pay

## 2024-01-26 ENCOUNTER — Ambulatory Visit (HOSPITAL_COMMUNITY): Admitting: Licensed Clinical Social Worker

## 2024-01-27 ENCOUNTER — Telehealth (HOSPITAL_COMMUNITY): Payer: Self-pay | Admitting: *Deleted

## 2024-01-27 NOTE — Telephone Encounter (Signed)
 Pt called to advise you that she is having issues with the new medication and would like to either speak to you, or get an earlier appointment if possible. She is currently scheduled for f/u on 03/06/23.

## 2024-01-27 NOTE — Telephone Encounter (Signed)
 Attempted to contact the patient twice at her mobile number [phone].  No answer, left a HIPAA compliant voicemail.

## 2024-01-31 NOTE — Telephone Encounter (Signed)
 Last read by [name] at 11:17AM on 01/26/2024.

## 2024-02-08 ENCOUNTER — Ambulatory Visit: Admitting: Family Medicine

## 2024-02-08 ENCOUNTER — Encounter: Payer: Self-pay | Admitting: Family Medicine

## 2024-02-08 VITALS — BP 137/81 | HR 81 | Ht 65.0 in | Wt 232.0 lb

## 2024-02-08 DIAGNOSIS — R350 Frequency of micturition: Secondary | ICD-10-CM | POA: Diagnosis not present

## 2024-02-08 LAB — POCT URINALYSIS DIP (CLINITEK)
Bilirubin, UA: NEGATIVE
Blood, UA: NEGATIVE
Glucose, UA: NEGATIVE mg/dL
Ketones, POC UA: NEGATIVE mg/dL
Leukocytes, UA: NEGATIVE
Nitrite, UA: NEGATIVE
POC PROTEIN,UA: NEGATIVE
Spec Grav, UA: 1.02 (ref 1.010–1.025)
Urobilinogen, UA: 0.2 U/dL
pH, UA: 6 (ref 5.0–8.0)

## 2024-02-08 NOTE — Progress Notes (Unsigned)
° °  Acute Office Visit  Subjective:     Patient ID: Kayla Nguyen, female    DOB: 2000/04/03, 23 y.o.   MRN: [id number]  Chief Complaint  Patient presents with   Urinary Tract Infection    HPI Patient is in today for   Subjective - Urinary frequency and urgency for the past 4 days. Reports a sudden, strong urge to urinate, feeling as if the bladder is full despite not having held it for a long time. Reports one episode of incontinence last night. No dysuria. No history of kidney stones. No history of vaginal births or trauma. No increased thirst. Reports urinating 7-8 times per day and waking 1-2 times per night to urinate. Stress incontinence is present only when there is a strong urge to urinate.  Medications Zoloft , propranolol , trazodone , iron, vitamin D . Reports no longer taking melatonin.  PMH, PSH, FH, Social Hx No significant past medical history. No history of kidney stones. No history of vaginal births or trauma. Labs from last month, including A1c, were normal.  ROS Constitutional: Denies increased thirst. Genitourinary: Reports urinary frequency, urgency, and one episode of incontinence. Denies dysuria. All other systems reviewed and are negative.    ROS      Objective:    BP 137/81   Pulse 81   Ht 5' 5 (1.651 m)   Wt 232 lb (105.2 kg)   SpO2 96%   BMI 38.61 kg/m  {Vitals History (Optional):23777}  Physical Exam Gen: alert, oriented Pulm: no respiratory distress Gi: soft, nontender.  Nbs.  Psych: pleasant affect  No results found for any visits on 02/08/24.      Assessment & Plan:   Urinary frequency Assessment & Plan: The patient presents with a 4-day history of urinary frequency and urgency. The urinalysis is negative for infection. The differential diagnosis includes a urinary tract infection, overactive bladder, or urge incontinence. Given the negative urinalysis, a UTI is less likely. The symptoms may be related to a new activity or dietary  change and could resolve spontaneously. We will await the results of the urine culture to guide further management. - A urine sample was sent for culture and sensitivity. - If the culture is positive, will start antibiotics. - If symptoms resolve before the culture results are back, no further action is needed. - If symptoms persist and the culture is negative, will consider evaluation for overactive bladder  Orders: -     POCT URINALYSIS DIP (CLINITEK) -     Urine Culture     Return if symptoms worsen or fail to improve.  Toribio MARLA Slain, MD

## 2024-02-08 NOTE — Patient Instructions (Signed)
 It was nice to see you today,  We addressed the following topics today: - We will let you know the culture results after we get them.  - If symptoms are not improving and the culture is negative, we will discuss further steps, such as treatment for overactive bladder.  Have a great day,  Rolan Slain, MD

## 2024-02-08 NOTE — Assessment & Plan Note (Signed)
 The patient presents with a 4-day history of urinary frequency and urgency. The urinalysis is negative for infection. The differential diagnosis includes a urinary tract infection, overactive bladder, or urge incontinence. Given the negative urinalysis, a UTI is less likely. The symptoms may be related to a new activity or dietary change and could resolve spontaneously. We will await the results of the urine culture to guide further management. - A urine sample was sent for culture and sensitivity. - If the culture is positive, will start antibiotics. - If symptoms resolve before the culture results are back, no further action is needed. - If symptoms persist and the culture is negative, will consider evaluation for overactive bladder

## 2024-02-09 ENCOUNTER — Ambulatory Visit (HOSPITAL_COMMUNITY): Admitting: Licensed Clinical Social Worker

## 2024-02-09 ENCOUNTER — Encounter (HOSPITAL_COMMUNITY): Payer: Self-pay | Admitting: Licensed Clinical Social Worker

## 2024-02-09 DIAGNOSIS — F331 Major depressive disorder, recurrent, moderate: Secondary | ICD-10-CM
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
## 2024-02-10 ENCOUNTER — Ambulatory Visit: Admitting: Cardiology

## 2024-02-11 LAB — URINE CULTURE

## 2024-02-13 ENCOUNTER — Ambulatory Visit: Payer: Self-pay | Admitting: Family Medicine

## 2024-02-13 NOTE — Progress Notes (Signed)
 Called patient she is advised of her lab results and recommendation pt stated that she does not want that referral at this time
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
## 2024-03-05 ENCOUNTER — Encounter (HOSPITAL_COMMUNITY): Payer: Self-pay | Admitting: Student in an Organized Health Care Education/Training Program

## 2024-03-05 ENCOUNTER — Ambulatory Visit (HOSPITAL_COMMUNITY): Admitting: Student in an Organized Health Care Education/Training Program

## 2024-03-05 DIAGNOSIS — F331 Major depressive disorder, recurrent, moderate: Secondary | ICD-10-CM

## 2024-03-05 DIAGNOSIS — F411 Generalized anxiety disorder: Secondary | ICD-10-CM | POA: Diagnosis not present

## 2024-03-05 DIAGNOSIS — Z87898 Personal history of other specified conditions: Secondary | ICD-10-CM

## 2024-03-05 DIAGNOSIS — G4700 Insomnia, unspecified: Secondary | ICD-10-CM

## 2024-03-05 MED ORDER — TRAZODONE HCL 50 MG PO TABS: 50.0000 mg | ORAL_TABLET | Freq: Every day | ORAL | 2 refills | Status: AC

## 2024-03-05 MED ORDER — SERTRALINE HCL 100 MG PO TABS: 200.0000 mg | ORAL_TABLET | Freq: Every day | ORAL | 2 refills | Status: AC

## 2024-03-07 ENCOUNTER — Ambulatory Visit (HOSPITAL_COMMUNITY): Admitting: Licensed Clinical Social Worker

## 2024-03-07 DIAGNOSIS — F330 Major depressive disorder, recurrent, mild: Secondary | ICD-10-CM | POA: Diagnosis not present

## 2024-03-08 ENCOUNTER — Encounter (HOSPITAL_COMMUNITY): Payer: Self-pay | Admitting: Licensed Clinical Social Worker

## 2024-03-08 NOTE — Progress Notes (Signed)
 Virtual Visit via Video Note  I connected with Narely Nobles on 03/07/24 at  1:30 PM EST by a video enabled telemedicine application and verified that I am speaking with the correct person using two identifiers.  Location: Patient: home Provider: home office   I discussed the limitations of evaluation and management by telemedicine and the availability of in person appointments. The patient expressed understanding and agreed to proceed.   I discussed the assessment and treatment plan with the patient. The patient was provided an opportunity to ask questions and all were answered. The patient agreed with the plan and demonstrated an understanding of the instructions.   The patient was advised to call back or seek an in-person evaluation if the symptoms worsen or if the condition fails to improve as anticipated.  I provided 40 minutes of non-face-to-face time during this encounter.   [name], LCSW   THERAPIST PROGRESS NOTE  Session Time: 1:30pm-2:10pm  Participation Level: Active  Behavioral Response: NeatAlertEuthymic  Type of Therapy: Individual Therapy  Treatment Goals addressed:  Active     OP Depression     LTG: Reduce frequency, intensity, and duration of depression symptoms so that daily functioning is improved (Progressing)     Start:  01/12/24    Expected End:  01/10/25         LTG: Increase coping skills to manage depression and improve ability to perform daily activities (Progressing)     Start:  01/12/24    Expected End:  01/10/25            ProgressTowards Goals: Progressing  Interventions: Solution Focused  Summary: Janit Cutter is a 24 y.o. female who presents with MDD, recurrent, mild.   Suicidal/Homicidal: Nowithout intent/plan  Therapist Response: Elyssia engaged well in individual virtual session with facilities manager. Clinician utilized solution-focused therapy to process current moods, interactions, and thought processes. Clinician  explored status of injured leg and plans for future working. Clinician reflected concerns about her long term ability to lift, walk, and carry items. Clinician also explored alternative options for work, possibly going back to school, or a career change.  Clinician explored relationships with mom and sister. Aljean reported progress in communication with mom and noted that the three of them are going on a girls cruise in the spring.   Plan: Return again in 2 weeks.  Diagnosis: MDD (major depressive disorder), recurrent episode, mild  Collaboration of Care: Patient refused AEB none required at this time. Mood and medications are stable  Patient/Guardian was advised Release of Information must be obtained prior to any record release in order to collaborate their care with an outside provider. Patient/Guardian was advised if they have not already done so to contact the registration department to sign all necessary forms in order for us  to release information regarding their care.   Consent: Patient/Guardian gives verbal consent for treatment and assignment of benefits for services provided during this visit. Patient/Guardian expressed understanding and agreed to proceed.   Harlene JONELLE [place]edo, LCSW 03/08/2024

## 2024-03-15 ENCOUNTER — Telehealth: Payer: Self-pay

## 2024-03-15 ENCOUNTER — Telehealth: Payer: Self-pay | Admitting: Neurology

## 2024-03-15 NOTE — Telephone Encounter (Signed)
 Needs to come from neurologist. I see a message has already been sent

## 2024-03-15 NOTE — Telephone Encounter (Signed)
 Copied from CRM #[id number]. Topic: Appointments - Appointment Scheduling >> Mar 15, 2024  2:16 PM Antony RAMAN wrote: Patient needs a excuse for having a seizure to take to work tomorrow, no appts until 1/26 [phone]

## 2024-03-15 NOTE — Telephone Encounter (Signed)
 Pt reports having 3 seizure last night. Called out of work today need note to return tomorrow.

## 2024-03-15 NOTE — Telephone Encounter (Signed)
 Pt states on today she had 3 non epileptic seizures and was unable to go to work, she states her job is asking for a note from her MD. Pt confirmed she did not got to ED or any kind of Urgent care, please call to discuss.

## 2024-03-15 NOTE — Telephone Encounter (Signed)
 I called pt.  She has has 3 non epileptic sz. Today.  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  She was last seen 08/2023,  returned to pcp, psychiatry. She says that due to stress and trauma has episodes shaking upper body, tightens fist, hands turn inward, chattering teeth, lying down stiff, R foot inward, no response.  Can last 20-30 min to 2 hours.  She has contacted her pcp but has not heard back.  I told her that Dr. Gregg is out of the office , she needs to follow up with pcp and psychiatry.

## 2024-03-16 ENCOUNTER — Telehealth: Admitting: Family Medicine

## 2024-03-16 DIAGNOSIS — R569 Unspecified convulsions: Secondary | ICD-10-CM

## 2024-03-16 NOTE — Progress Notes (Signed)
 " Virtual Visit Consent   Kayla Nguyen, you are scheduled for a virtual visit with a [facility] provider today. Just as with appointments in the office, your consent must be obtained to participate. Your consent will be active for this visit and any virtual visit you may have with one of our providers in the next 365 days. If you have a MyChart account, a copy of this consent can be sent to you electronically.  As this is a virtual visit, video technology does not allow for your provider to perform a traditional examination. This may limit your provider's ability to fully assess your condition. If your provider identifies any concerns that need to be evaluated in person or the need to arrange testing (such as labs, EKG, etc.), we will make arrangements to do so. Although advances in technology are sophisticated, we cannot ensure that it will always work on either your end or our end. If the connection with a video visit is poor, the visit may have to be switched to a telephone visit. With either a video or telephone visit, we are not always able to ensure that we have a secure connection.  By engaging in this virtual visit, you consent to the provision of healthcare and authorize for your insurance to be billed (if applicable) for the services provided during this visit. Depending on your insurance coverage, you may receive a charge related to this service.  I need to obtain your verbal consent now. Are you willing to proceed with your visit today? Kayla Nguyen has provided verbal consent on 03/16/2024 for a virtual visit (video or telephone). Kayla Lamp, FNP  Date: 03/16/2024 2:15 PM   Virtual Visit via Video Note   I, Kayla Nguyen, connected with  Kayla Nguyen  ([id number], March 07, 2000) on 03/16/24 at  2:15 PM EST by a video-enabled telemedicine application and verified that I am speaking with the correct person using two identifiers.  Location: Patient: Virtual Visit Location Patient:  Home Provider: Virtual Visit Location Provider: Home Office   I discussed the limitations of evaluation and management by telemedicine and the availability of in person appointments. The patient expressed understanding and agreed to proceed.    History of Present Illness: Kayla Nguyen is a 24 y.o. who identifies as a female who was assigned female at birth, and is being seen today for seizures, has neurologist, had to leave early due to aura today. Neuro out of office. See notes from yesterday with neuro. She also is followed by psych and cardiology. Work has requested her to have a note.   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/2024   Class 2 obesity without serious comorbidity with body mass index (BMI) of 38.0 to 38.9 in adult 01/03/2024   Chest tightness 07/01/2023   Generalized anxiety disorder 07/01/2023   Major depressive disorder, single episode, moderate (HCC) 07/01/2023   Seizure-like activity (HCC) 06/30/2023   Lymphadenopathy of head and neck 06/01/2023   Lower abdominal pain 06/01/2023   Pain of both wrist joints 06/01/2023   Other fatigue 06/01/2023   Vaginal bleeding 06/01/2023   Butterfly rash 06/01/2023   Irregular periods 03/01/2023   Vitamin D  deficiency 03/01/2023    Allergies: Allergies[1] Medications: Current Medications[2]  Observations/Objective: Patient is well-developed, well-nourished in no acute distress.  Resting comfortably  at home.  Head is normocephalic, atraumatic.  No labored breathing.  Speech is clear  and coherent with logical content.  Patient is alert and oriented at baseline.    Assessment and Plan: There are no diagnoses linked to this encounter. Follow up with neuro as planned, Go to ED for further seizure like activity.   Follow Up Instructions: I discussed the assessment and treatment plan with the patient. The patient was  provided an opportunity to ask questions and all were answered. The patient agreed with the plan and demonstrated an understanding of the instructions.  A copy of instructions were sent to the patient via MyChart unless otherwise noted below.     The patient was advised to call back or seek an in-person evaluation if the symptoms worsen or if the condition fails to improve as anticipated.    Janeen Watson, FNP     [1] No Known Allergies [2]  Current Outpatient Medications:    Cholecalciferol  1.25 MG (50000 UT) TABS, Take 50,000 Units by mouth once a week., Disp: 12 tablet, Rfl: 0   Ferrous Sulfate (IRON PO), Take by mouth daily., Disp: , Rfl:    propranolol  (INDERAL ) 10 MG tablet, Take 1 tablet (10 mg total) by mouth at bedtime., Disp: 90 tablet, Rfl: 3   [START ON 04/16/2024] sertraline  (ZOLOFT ) 100 MG tablet, Take 2 tablets (200 mg total) by mouth daily., Disp: 60 tablet, Rfl: 2   [START ON 04/16/2024] traZODone  (DESYREL ) 50 MG tablet, Take 1 tablet (50 mg total) by mouth at bedtime., Disp: 30 tablet, Rfl: 2  "

## 2024-03-16 NOTE — Patient Instructions (Signed)
 Non-Epileptic Seizures in Adults: What to Know A non-epileptic seizure is an event that can cause unusual body movements or a loss of consciousness. Unlike epileptic seizures, these seizures aren't caused by abnormal activity in the brain. There are two types of non-epileptic seizures: Physiologic. This type is caused by problems with how the body works. Psychogenic. This type happens because of problems with mental health. What are the causes? The cause depends on the kind of non-epileptic seizure you have. Causes of physiologic seizures Sudden drop in blood pressure or heart rhythm problems. Low blood sugar (glucose) or low levels of salt (sodium) in your blood. Migraine headaches. Sleep or movement disorders. Certain medicines. Heavy use of drugs or alcohol. Head injuries. Causes of psychogenic seizures Stress. Emotional trauma. Sexual or physical abuse. Big life events, such as divorce or death of a loved one. Mental health disorders, like anxiety and depression. What are the signs or symptoms? The symptoms of a non-epileptic seizure can be like those of an epileptic seizure. They may include: A change in attention or behavior. Fainting or a loss of consciousness. Having convulsions. This means shaking that you can't control along with fast, jerking movements. Drooling, tongue biting, or grunting. Fast eye movements. Not being able to control when you pee or poop. After the seizure, you may: Have a headache or sore muscles. Feel confused or sleepy. Non-epileptic seizures usually: Don't cause physical injuries. Start slowly. Include crying or shrieking. Last longer than 2 minutes. Include pelvic thrusting. How is this diagnosed? Non-epileptic seizures may be diagnosed based on: Your medical history and a physical exam. Your symptoms. Your health care provider may: Talk with your friends or family members who have seen you have a seizure. Ask you to write down your  seizure activity and the things that led up to the seizure. You may also have tests. These can help find the cause of physiologic seizures. Tests may include: An electroencephalogram (EEG). This checks the electrical activity in your brain. A video EEG done in the hospital. This takes 2-7 days. Blood tests. An electrocardiogram (ECG). This checks for an abnormal heart rhythm. A CT scan or MRI. If your provider thinks you have had a psychogenic seizure, you may need to see a mental health specialist. How is this treated? Treatment will depend on the cause of your seizures. When the condition that caused them is treated, your seizures should stop. Medicines used for treating epileptic seizures won't help with non-epileptic seizures. If your seizures are being caused by mental trauma or stress, a mental health specialist may help with treatment. This may include: Relaxation therapy or cognitive behavioral therapy (CBT). Medicines for depression or anxiety. Individual or family counseling. Some people have both psychogenic seizures and epileptic seizures. If you have both of these types, you may be given medicine to manage the epileptic seizures. Follow these instructions at home: Home care will depend on the type of seizures you have. Follow this general guidance: Avoid alcohol and drug use Avoid using any substance that may prevent your medicine from working well. If you're prescribed medicine for seizures: Do not use drugs. Limit or avoid alcohol. If you drink alcohol: Limit how much you have to: 0-1 drink a day if you're female. 0-2 drinks a day if you're female. Know how much alcohol is in your drink. In the [place] one drink is one 12 oz bottle of beer (355 mL), one 5 oz glass of wine (148 mL), or one 1 oz glass of hard  liquor (44 mL). Teach others how to help Teach friends, family, and coworkers what to do if you have a seizure. Tell them to: Help you get down to the ground safely. Place  a pillow or piece of clothing under your head. Loosen any clothing around your neck. Turn you onto your side. This helps keep your windpipe clear if you throw up. General instructions Follow all instructions from your provider. These may include ways to prevent seizures and what to do if you have a seizure. Take medicines only as told. Keep all follow-up visits. Your provider will check to make sure the treatments are still working. Contact a health care provider if: Your seizures change or happen more often. Your seizures don't stop after treatment. Get help right away if: You injure yourself during a seizure. You have or someone sees you have: One non-epileptic seizure after another. A seizure that lasts longer than 5 minutes. You have trouble recovering from a seizure. You have chest pain or trouble breathing. These symptoms may be an emergency. Call 911 right away. Do not wait to see if the symptoms will go away. Do not drive yourself to the hospital. Also, get help right away if: You feel like you may hurt yourself or others. You have thoughts about taking your own life. You have other thoughts or feelings that worry you. Take one of these steps right away: Go to your nearest emergency room. Call 911. Call the Suicide & Crisis Lifeline (free and confidential): Call 920-023-3789 or 988. Text 917-365-1477. This information is not intended to replace advice given to you by your health care provider. Make sure you discuss any questions you have with your health care provider. Document Revised: 07/25/2022 Document Reviewed: 07/25/2022 [employer] Patient Education  2024 [employer].

## 2024-03-20 ENCOUNTER — Encounter (HOSPITAL_COMMUNITY): Payer: Self-pay

## 2024-03-20 ENCOUNTER — Ambulatory Visit (HOSPITAL_COMMUNITY): Admitting: Licensed Clinical Social Worker

## 2024-04-04 ENCOUNTER — Encounter

## 2024-04-05 ENCOUNTER — Ambulatory Visit (HOSPITAL_COMMUNITY): Admitting: Licensed Clinical Social Worker

## 2024-04-18 ENCOUNTER — Ambulatory Visit (HOSPITAL_COMMUNITY): Admitting: Licensed Clinical Social Worker

## 2024-04-23 ENCOUNTER — Ambulatory Visit (HOSPITAL_COMMUNITY): Admitting: Student in an Organized Health Care Education/Training Program
